# Patient Record
Sex: Male | Born: 1955
Health system: Southern US, Community
[De-identification: ages and names within clinical notes are randomized; demographics above are authoritative.]

## PROBLEM LIST (undated history)

## (undated) ENCOUNTER — Ambulatory Visit: Payer: Self-pay

## (undated) DIAGNOSIS — E119 Type 2 diabetes mellitus without complications: Secondary | ICD-10-CM

---

## 2016-12-27 ENCOUNTER — Encounter (HOSPITAL_COMMUNITY): Payer: Self-pay | Admitting: Emergency Medicine

## 2016-12-27 ENCOUNTER — Inpatient Hospital Stay (HOSPITAL_COMMUNITY)
Admission: EM | Admit: 2016-12-27 | Discharge: 2017-01-15 | DRG: 341 | Disposition: A | Discharge status: Home / Self Care | Payer: Self-pay | Attending: Surgery | Admitting: Surgery

## 2016-12-27 ENCOUNTER — Emergency Department (HOSPITAL_COMMUNITY): Payer: Self-pay

## 2016-12-27 ENCOUNTER — Ambulatory Visit (HOSPITAL_COMMUNITY)
Admission: EM | Admit: 2016-12-27 | Discharge: 2016-12-27 | Disposition: A | Discharge status: Home / Self Care | Payer: Self-pay

## 2016-12-27 ENCOUNTER — Other Ambulatory Visit: Payer: Self-pay

## 2016-12-27 DIAGNOSIS — Z9889 Other specified postprocedural states: Secondary | ICD-10-CM

## 2016-12-27 DIAGNOSIS — R000 Tachycardia, unspecified: Secondary | ICD-10-CM | POA: Diagnosis not present

## 2016-12-27 DIAGNOSIS — K352 Acute appendicitis with generalized peritonitis, without abscess: Principal | ICD-10-CM | POA: Diagnosis present

## 2016-12-27 DIAGNOSIS — I2602 Saddle embolus of pulmonary artery with acute cor pulmonale: Secondary | ICD-10-CM | POA: Diagnosis present

## 2016-12-27 DIAGNOSIS — Z79899 Other long term (current) drug therapy: Secondary | ICD-10-CM

## 2016-12-27 DIAGNOSIS — D72829 Elevated white blood cell count, unspecified: Secondary | ICD-10-CM

## 2016-12-27 DIAGNOSIS — K567 Ileus, unspecified: Secondary | ICD-10-CM | POA: Diagnosis not present

## 2016-12-27 DIAGNOSIS — R109 Unspecified abdominal pain: Secondary | ICD-10-CM

## 2016-12-27 DIAGNOSIS — E119 Type 2 diabetes mellitus without complications: Secondary | ICD-10-CM | POA: Diagnosis present

## 2016-12-27 DIAGNOSIS — K3580 Unspecified acute appendicitis: Secondary | ICD-10-CM | POA: Diagnosis present

## 2016-12-27 DIAGNOSIS — Z23 Encounter for immunization: Secondary | ICD-10-CM

## 2016-12-27 DIAGNOSIS — I82401 Acute embolism and thrombosis of unspecified deep veins of right lower extremity: Secondary | ICD-10-CM | POA: Diagnosis not present

## 2016-12-27 DIAGNOSIS — R1110 Vomiting, unspecified: Secondary | ICD-10-CM

## 2016-12-27 DIAGNOSIS — J189 Pneumonia, unspecified organism: Secondary | ICD-10-CM | POA: Diagnosis not present

## 2016-12-27 DIAGNOSIS — K3589 Other acute appendicitis without perforation or gangrene: Secondary | ICD-10-CM

## 2016-12-27 DIAGNOSIS — R339 Retention of urine, unspecified: Secondary | ICD-10-CM | POA: Diagnosis present

## 2016-12-27 DIAGNOSIS — Z6841 Body Mass Index (BMI) 40.0 and over, adult: Secondary | ICD-10-CM

## 2016-12-27 DIAGNOSIS — I2699 Other pulmonary embolism without acute cor pulmonale: Secondary | ICD-10-CM

## 2016-12-27 DIAGNOSIS — I82411 Acute embolism and thrombosis of right femoral vein: Secondary | ICD-10-CM | POA: Diagnosis not present

## 2016-12-27 DIAGNOSIS — N179 Acute kidney failure, unspecified: Secondary | ICD-10-CM | POA: Diagnosis not present

## 2016-12-27 DIAGNOSIS — R198 Other specified symptoms and signs involving the digestive system and abdomen: Secondary | ICD-10-CM

## 2016-12-27 DIAGNOSIS — J9601 Acute respiratory failure with hypoxia: Secondary | ICD-10-CM | POA: Diagnosis not present

## 2016-12-27 DIAGNOSIS — Z4659 Encounter for fitting and adjustment of other gastrointestinal appliance and device: Secondary | ICD-10-CM

## 2016-12-27 DIAGNOSIS — Y95 Nosocomial condition: Secondary | ICD-10-CM | POA: Diagnosis not present

## 2016-12-27 HISTORY — DX: Type 2 diabetes mellitus without complications: E11.9

## 2016-12-27 LAB — I-STAT CG4 LACTIC ACID, ED: Lactic Acid, Venous: 3.56 mmol/L (ref 0.5–1.9)

## 2016-12-27 LAB — URINALYSIS, ROUTINE W REFLEX MICROSCOPIC
BILIRUBIN URINE: NEGATIVE
Glucose, UA: 150 mg/dL — AB
HGB URINE DIPSTICK: NEGATIVE
Ketones, ur: NEGATIVE mg/dL
Leukocytes, UA: NEGATIVE
Nitrite: NEGATIVE
PH: 5 (ref 5.0–8.0)
Protein, ur: NEGATIVE mg/dL
Specific Gravity, Urine: 1.025 (ref 1.005–1.030)

## 2016-12-27 LAB — COMPREHENSIVE METABOLIC PANEL
ALT: 15 U/L — ABNORMAL LOW (ref 17–63)
ANION GAP: 11 (ref 5–15)
AST: 18 U/L (ref 15–41)
Albumin: 3.4 g/dL — ABNORMAL LOW (ref 3.5–5.0)
Alkaline Phosphatase: 81 U/L (ref 38–126)
BILIRUBIN TOTAL: 1.2 mg/dL (ref 0.3–1.2)
BUN: 18 mg/dL (ref 6–20)
CO2: 20 mmol/L — AB (ref 22–32)
Calcium: 9 mg/dL (ref 8.9–10.3)
Chloride: 101 mmol/L (ref 101–111)
Creatinine, Ser: 1.65 mg/dL — ABNORMAL HIGH (ref 0.61–1.24)
GFR calc Af Amer: 50 mL/min — ABNORMAL LOW (ref >60)
GFR calc non Af Amer: 43 mL/min — ABNORMAL LOW (ref >60)
GLUCOSE: 215 mg/dL — AB (ref 65–99)
POTASSIUM: 4 mmol/L (ref 3.5–5.1)
Sodium: 132 mmol/L — ABNORMAL LOW (ref 135–145)
Total Protein: 7.7 g/dL (ref 6.5–8.1)

## 2016-12-27 LAB — CBC
HEMATOCRIT: 47.5 % (ref 39.0–52.0)
HEMOGLOBIN: 15.9 g/dL (ref 13.0–17.0)
MCH: 31.2 pg (ref 26.0–34.0)
MCHC: 33.5 g/dL (ref 30.0–36.0)
MCV: 93.3 fL (ref 78.0–100.0)
Platelets: 300 10*3/uL (ref 150–400)
RBC: 5.09 MIL/uL (ref 4.22–5.81)
RDW: 15.2 % (ref 11.5–15.5)
WBC: 10.1 10*3/uL (ref 4.0–10.5)

## 2016-12-27 LAB — LIPASE, BLOOD: Lipase: 15 U/L (ref 11–51)

## 2016-12-27 MED ORDER — MORPHINE SULFATE (PF) 4 MG/ML IV SOLN
4.0000 mg | Freq: Once | INTRAVENOUS | Status: AC
  Administered 2016-12-27: 4 mg via INTRAVENOUS
  Filled 2016-12-27: qty 1

## 2016-12-27 MED ORDER — SODIUM CHLORIDE 0.9 % IV BOLUS (SEPSIS)
1000.0000 mL | Freq: Once | INTRAVENOUS | Status: AC
  Administered 2016-12-27: 1000 mL via INTRAVENOUS

## 2016-12-27 MED ORDER — IOPAMIDOL (ISOVUE-300) INJECTION 61%
INTRAVENOUS | Status: AC
  Administered 2016-12-27: 100 mL
  Filled 2016-12-27: qty 100

## 2016-12-27 NOTE — ED Notes (Signed)
Pt reports Urgent care sent him to ED for further evaluation.

## 2016-12-27 NOTE — ED Triage Notes (Signed)
Patient arrives with complaint of lower abdominal pain. Onset 1 week ago. States he ate a large meal, felt fine after. Laid down and then noticed the pain afterward. 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 Denies history of hernias. Patient with temp of 100.2 in triage. Denies NVD and urinary symptoms.

## 2016-12-27 NOTE — ED Triage Notes (Signed)
PT C/O: groin pain  ONSET: 1 week  SX INCLUDE: pain increases w/activity  DENIES: inj/trauma  TAKING MEDS: none  A&O x4... NAD... Ambulatory

## 2016-12-27 NOTE — Discharge Instructions (Signed)
Go directly to the ED

## 2016-12-27 NOTE — ED Provider Notes (Addendum)
12/27/2016 7:20 PM   DOB: 06-Jun-1955 / MRN: [id number]  SUBJECTIVE:  Tyler Franklin is a 61 y.o. male presenting for abdominal pain that started last night.  Feels that he is getting worse quickly.  Denies fever but admits to some chills.  Has a history of diabetes and no information is available for review.  Has not been able to have a bowel movement since yesterday. No history of hernia.  No history of abdominal surgery.   He has No Known Allergies.   He  has a past medical history of Diabetes mellitus without complication (HCC).    He  reports that  has never smoked. he has never used smokeless tobacco. He reports that he does not drink alcohol or use drugs. He  has no sexual activity history on file. The patient  has no past surgical history on file.  His family history is not on file.  Review of Systems  Respiratory: Negative for shortness of breath.   Cardiovascular: Negative for chest pain, orthopnea and leg swelling.  Gastrointestinal: Positive for abdominal pain and constipation. Negative for blood in stool, diarrhea, heartburn, melena, nausea and vomiting.  Neurological: Negative for dizziness.    OBJECTIVE:  BP 116/76 (BP Location: Left Arm)   Pulse (!) 125   Temp 98.2 F (36.8 C) (Oral)   Resp 18   SpO2 95%   BP Readings from Last 3 Encounters:  12/27/16 116/76     Physical Exam  Constitutional: He is oriented to person, place, and time. He appears well-developed. He is active and cooperative.  Non-toxic appearance.  Cardiovascular: Normal rate, regular rhythm, S1 normal, S2 normal, normal heart sounds, intact distal pulses and normal pulses. Exam reveals no gallop and no friction rub.  No murmur heard. Pulmonary/Chest: Effort normal. No stridor. No tachypnea. No respiratory distress. He has no wheezes. He has no rales.  Abdominal: Soft. He exhibits no distension and no mass. There is tenderness. There is guarding. There is no rebound. Hernia confirmed negative in the  right inguinal area and confirmed negative in the left inguinal area.  Genitourinary: Testes normal. Right testis shows no mass, no swelling and no tenderness. Right testis is descended. Left testis shows no mass, no swelling and no tenderness. Left testis is descended. Uncircumcised.  Musculoskeletal: He exhibits no edema, tenderness or deformity.  Lymphadenopathy:       Right: No inguinal adenopathy present.       Left: No inguinal adenopathy present.  Neurological: He is alert and oriented to person, place, and time. He displays normal reflexes. No cranial nerve deficit. He exhibits normal muscle tone. Coordination normal.  Skin: Skin is warm and dry. He is not diaphoretic. No pallor.  Vitals reviewed.   No results found for this or any previous visit (from the past 72 hour(s)).  No results found.  ASSESSMENT AND PLAN:  The primary encounter diagnosis was Abdominal involuntary guarding. A diagnosis of Tachycardia was also pertinent to this visit. History of diabetes with no information in the system.  I feel that he needs a CT scan given his exam. He reluctantly agrees to go to the ED and has a family member with him who will escort him.     The patient is advised to call or return to clinic if he does not see an improvement in symptoms, or to seek the care of the closest emergency department if he worsens with the above plan.   Deliah BostonMichael Franklin, MHS, PA-C 12/27/2016 7:20 PM  Tyler Franklin, Tyler L, PA-C 12/27/16 1921    Tyler Franklin, Tyler L, PA-C 12/27/16 Ernestina [id number]

## 2016-12-27 NOTE — ED Notes (Signed)
Patient transported to CT 

## 2016-12-27 NOTE — ED Provider Notes (Signed)
[facility] Provider Note   CSN: [id number] Arrival date & time: 12/27/16  1927     History   Chief Complaint Chief Complaint  Patient presents with  . Abdominal Pain    HPI Tyler Franklin is a 61 y.o. male.  The history is provided by the patient and a relative.  Abdominal Pain     61 year old male with history of diabetes, presenting to the ED from urgent care for abdominal pain.  Reports this began on Wednesday but worsened yesterday after eating Thanksgiving dinner.  The pain localized to the lower abdomen.  States pain does not seem to radiate anywhere else.  He denies any nausea, vomiting, or diarrhea.  States he has not had any trouble with bowel movements.  He is able to pass flatus when needed.  No prior abdominal surgeries.  At urgent care patient was noted to have a low-grade temperature, patient reports "I was wearing too many closed because it is cold out".  He has not had any medications PTA.  Past Medical History:  Diagnosis Date  . Diabetes mellitus without complication (HCC)     There are no active problems to display for this patient.   History reviewed. No pertinent surgical history.     Home Medications    Prior to Admission medications   Medication Sig Start Date End Date Taking? Authorizing Provider  Multiple Vitamin (ONE-A-DAY MENS PO) Take 1 tablet by mouth daily.   Yes [provider]    Family History History reviewed. No pertinent family history.  Social History Social History   Tobacco Use  . Smoking status: Never Smoker  . Smokeless tobacco: Never Used  Substance Use Topics  . Alcohol use: No    Frequency: Never  . Drug use: No     Allergies   Patient has no known allergies.   Review of Systems Review of Systems  Gastrointestinal: Positive for abdominal pain.  All other systems reviewed and are negative.    Physical Exam Updated Vital Signs BP 103/66   Pulse (!) 108    Temp 97.8 F (36.6 C) (Oral)   Resp 18   Ht 5\' 11"  (1.803 m)   Wt 131.5 kg (290 lb)   SpO2 94%   BMI 40.45 kg/m   Physical Exam  Constitutional: He is oriented to person, place, and time. He appears well-developed and well-nourished.  HENT:  Head: Normocephalic and atraumatic.  Mouth/Throat: Oropharynx is clear and moist.  Eyes: Conjunctivae and EOM are normal. Pupils are equal, round, and reactive to light.  Neck: Normal range of motion.  Cardiovascular: Normal rate, regular rhythm and normal heart sounds.  Pulmonary/Chest: Effort normal and breath sounds normal.  Abdominal: Soft. Bowel sounds are normal. There is tenderness in the right lower quadrant, suprapubic area and left lower quadrant.  Tenderness all across lower abdomen, feels a little firm but not rigid; normal bowel sounds  Musculoskeletal: Normal range of motion.  Neurological: He is alert and oriented to person, place, and time.  Skin: Skin is warm and dry.  Psychiatric: He has a normal mood and affect.  Nursing note and vitals reviewed.    ED Treatments / Results  Labs (all labs ordered are listed, but only abnormal results are displayed) Labs Reviewed  COMPREHENSIVE METABOLIC PANEL - Abnormal; Notable for the following components:      Result Value   Sodium 132 (*)    CO2 20 (*)    Glucose, Bld 215 (*)  Creatinine, Ser 1.65 (*)    Albumin 3.4 (*)    ALT 15 (*)    GFR calc non Af Amer 43 (*)    GFR calc Af Amer 50 (*)    All other components within normal limits  URINALYSIS, ROUTINE W REFLEX MICROSCOPIC - Abnormal; Notable for the following components:   Color, Urine AMBER (*)    APPearance HAZY (*)    Glucose, UA 150 (*)    All other components within normal limits  I-STAT CG4 LACTIC ACID, ED - Abnormal; Notable for the following components:   Lactic Acid, Venous 3.56 (*)    All other components within normal limits  I-STAT CG4 LACTIC ACID, ED - Abnormal; Notable for the following components:    Lactic Acid, Venous 2.74 (*)    All other components within normal limits  LIPASE, BLOOD  CBC  HIV ANTIBODY (ROUTINE TESTING)  BASIC METABOLIC PANEL  CBC  HEMOGLOBIN A1C    EKG  EKG Interpretation None       Radiology Ct Abdomen Pelvis W Contrast  Result Date: 12/27/2016 CLINICAL DATA:  Lower abdominal pain starting 1 week ago. EXAM: CT ABDOMEN AND PELVIS WITH CONTRAST TECHNIQUE: Multidetector CT imaging of the abdomen and pelvis was performed using the standard protocol following bolus administration of intravenous contrast. CONTRAST:  100mL ISOVUE-300 IOPAMIDOL (ISOVUE-300) INJECTION 61% COMPARISON:  None. FINDINGS: Lower chest: Top-normal size heart with streaky atelectasis at each lung base. No effusion or pneumothorax. Hepatobiliary: Mild hepatic steatosis. There is colonic interposition between the liver and ventral abdominal wall. Faint layering densities within the gallbladder may reflect biliary sludge and/or non opaque stones. No biliary dilatation. Pancreas: Normal Spleen: Normal Adrenals/Urinary Tract: Normal bilateral adrenal glands. Water attenuating cysts are seen within both kidneys the largest is in the upper pole of the left kidney measuring up to 11.7 cm. Stomach/Bowel: Appendix: Location: McBurney's point, series 3 image 73 and coronal series 6, images 27 through 33 Diameter: 18 mm in diameter Appendicolith: Fecal residue is seen within distally. Mucosal hyper-enhancement: Present Extraluminal gas: No Periappendiceal collection: No Fluid distended stomach. Normal small bowel rotation. The colon is nonacute. Vascular/Lymphatic: No significant vascular findings are present. No enlarged abdominal or pelvic lymph nodes. Reproductive: Prostate is unremarkable. Other: No abdominal wall hernia or abnormality. No abdominopelvic ascites. Musculoskeletal: No acute osseous abnormality. Mild thoracolumbar spondylosis. IMPRESSION: Distended hyperemic appendix located at McBurney's point  containing soft fecal residue within and measuring up to 18 mm in diameter. No complications of abscess nor perforation. Electronically Signed   By: Tollie Ethavid  Kwon M.D.   On: 12/27/2016 23:38    Procedures Procedures (including critical care time)  Medications Ordered in ED Medications  morphine 4 MG/ML injection 4 mg (4 mg Intravenous Given 12/27/16 2250)  sodium chloride 0.9 % bolus 1,000 mL (1,000 mLs Intravenous New Bag/Given 12/27/16 2250)     Initial Impression / Assessment and Plan / ED Course  I have reviewed the triage vital signs and the nursing notes.  Pertinent labs & imaging results that were available during my care of the patient were reviewed by me and considered in my medical decision making (see chart for details).  61 year old male here with lower abdominal pain.  Began Thursday evening and has been worsening.  Had low-grade fever and tachycardia at urgent care, transferred here for further evaluation.  Lactate elevated at 3.56.  White count is normal.  Bump in creatinine noted, patient does report he feels dehydrated.  IV fluids and pain  medications ordered.  Will obtain CT scan for further evaluation.  CT scan with evidence of acute appendicitis.  Lactic acid seems to be trending down but still mildly elevated at 2.74.  In light of this, we will go ahead and start Zosyn.  Discussed with on-call general surgery, Dr. Doylene Canard, she will admit for ongoing care.  Plan for OR this morning.  Final Clinical Impressions(s) / ED Diagnoses   Final diagnoses:  Other acute appendicitis    ED Discharge Orders    None       Garlon Hatchet, PA-C 12/28/16 1610    Gilda Crease, MD 12/28/16 [phone]

## 2016-12-28 ENCOUNTER — Encounter (HOSPITAL_COMMUNITY): Admission: EM | Disposition: A | Discharge status: Home / Self Care | Payer: Self-pay | Source: Home / Self Care

## 2016-12-28 ENCOUNTER — Observation Stay (HOSPITAL_COMMUNITY): Payer: Self-pay | Admitting: Anesthesiology

## 2016-12-28 ENCOUNTER — Encounter (HOSPITAL_COMMUNITY): Payer: Self-pay | Admitting: Anesthesiology

## 2016-12-28 DIAGNOSIS — K3580 Unspecified acute appendicitis: Secondary | ICD-10-CM | POA: Diagnosis present

## 2016-12-28 HISTORY — PX: LAPAROSCOPIC APPENDECTOMY: SHX408

## 2016-12-28 LAB — CBC
HEMATOCRIT: 44.1 % (ref 39.0–52.0)
HEMOGLOBIN: 14.9 g/dL (ref 13.0–17.0)
MCH: 31.6 pg (ref 26.0–34.0)
MCHC: 33.8 g/dL (ref 30.0–36.0)
MCV: 93.6 fL (ref 78.0–100.0)
PLATELETS: 253 10*3/uL (ref 150–400)
RBC: 4.71 MIL/uL (ref 4.22–5.81)
RDW: 15.1 % (ref 11.5–15.5)
WBC: 17.4 10*3/uL — AB (ref 4.0–10.5)

## 2016-12-28 LAB — BASIC METABOLIC PANEL
ANION GAP: 9 (ref 5–15)
BUN: 22 mg/dL — AB (ref 6–20)
CHLORIDE: 103 mmol/L (ref 101–111)
CO2: 21 mmol/L — ABNORMAL LOW (ref 22–32)
Calcium: 8.1 mg/dL — ABNORMAL LOW (ref 8.9–10.3)
Creatinine, Ser: 2.13 mg/dL — ABNORMAL HIGH (ref 0.61–1.24)
GFR calc Af Amer: 37 mL/min — ABNORMAL LOW (ref >60)
GFR, EST NON AFRICAN AMERICAN: 32 mL/min — AB (ref >60)
Glucose, Bld: 192 mg/dL — ABNORMAL HIGH (ref 65–99)
POTASSIUM: 5.9 mmol/L — AB (ref 3.5–5.1)
SODIUM: 133 mmol/L — AB (ref 135–145)

## 2016-12-28 LAB — GLUCOSE, CAPILLARY
GLUCOSE-CAPILLARY: 192 mg/dL — AB (ref 65–99)
GLUCOSE-CAPILLARY: 169 mg/dL — AB (ref 65–99)
GLUCOSE-CAPILLARY: 193 mg/dL — AB (ref 65–99)
Glucose-Capillary: 125 mg/dL — ABNORMAL HIGH (ref 65–99)
Glucose-Capillary: 122 mg/dL — ABNORMAL HIGH (ref 65–99)

## 2016-12-28 LAB — I-STAT CG4 LACTIC ACID, ED: Lactic Acid, Venous: 2.74 mmol/L (ref 0.5–1.9)

## 2016-12-28 LAB — HIV ANTIBODY (ROUTINE TESTING W REFLEX): HIV SCREEN 4TH GENERATION: NONREACTIVE

## 2016-12-28 SURGERY — APPENDECTOMY, LAPAROSCOPIC
Anesthesia: General | Site: Abdomen

## 2016-12-28 MED ORDER — HEPARIN SODIUM (PORCINE) 5000 UNIT/ML IJ SOLN
5000.0000 [IU] | Freq: Three times a day (TID) | INTRAMUSCULAR | Status: DC
  Administered 2016-12-28 – 2017-01-06 (×28): 5000 [IU] via SUBCUTANEOUS
  Filled 2016-12-28 (×28): qty 1

## 2016-12-28 MED ORDER — PROPOFOL 10 MG/ML IV BOLUS
INTRAVENOUS | Status: AC
  Filled 2016-12-28: qty 40

## 2016-12-28 MED ORDER — ACETAMINOPHEN 650 MG RE SUPP: 650.0000 mg | Freq: Four times a day (QID) | RECTAL | Status: DC | PRN

## 2016-12-28 MED ORDER — FENTANYL CITRATE (PF) 250 MCG/5ML IJ SOLN
INTRAMUSCULAR | Status: AC
  Filled 2016-12-28: qty 5

## 2016-12-28 MED ORDER — DOCUSATE SODIUM 100 MG PO CAPS
100.0000 mg | ORAL_CAPSULE | Freq: Two times a day (BID) | ORAL | Status: DC
  Administered 2016-12-28 – 2017-01-02 (×10): 100 mg via ORAL
  Filled 2016-12-28 (×11): qty 1

## 2016-12-28 MED ORDER — ACETAMINOPHEN 325 MG PO TABS
650.0000 mg | ORAL_TABLET | Freq: Four times a day (QID) | ORAL | Status: DC | PRN
  Administered 2017-01-08: 650 mg via ORAL
  Filled 2016-12-28: qty 2

## 2016-12-28 MED ORDER — HYDRALAZINE HCL 20 MG/ML IJ SOLN: 10.0000 mg | INTRAMUSCULAR | Status: DC | PRN

## 2016-12-28 MED ORDER — SUCCINYLCHOLINE CHLORIDE 20 MG/ML IJ SOLN
INTRAMUSCULAR | Status: DC | PRN
  Administered 2016-12-28: 100 mg via INTRAVENOUS

## 2016-12-28 MED ORDER — PHENYLEPHRINE HCL 10 MG/ML IJ SOLN
INTRAMUSCULAR | Status: DC | PRN
  Administered 2016-12-28 (×4): 40 ug via INTRAVENOUS

## 2016-12-28 MED ORDER — POLYETHYLENE GLYCOL 3350 17 G PO PACK: 17.0000 g | PACK | Freq: Every day | ORAL | Status: DC | PRN

## 2016-12-28 MED ORDER — ROCURONIUM BROMIDE 100 MG/10ML IV SOLN
INTRAVENOUS | Status: DC | PRN
  Administered 2016-12-28: 10 mg via INTRAVENOUS
  Administered 2016-12-28: 40 mg via INTRAVENOUS

## 2016-12-28 MED ORDER — HYDROMORPHONE HCL 1 MG/ML IJ SOLN: 0.2500 mg | INTRAMUSCULAR | Status: DC | PRN

## 2016-12-28 MED ORDER — EPHEDRINE SULFATE 50 MG/ML IJ SOLN
INTRAMUSCULAR | Status: DC | PRN
  Administered 2016-12-28: 5 mg via INTRAVENOUS

## 2016-12-28 MED ORDER — PIPERACILLIN-TAZOBACTAM 3.375 G IVPB 30 MIN
3.3750 g | Freq: Once | INTRAVENOUS | Status: AC
  Administered 2016-12-28: 3.375 g via INTRAVENOUS
  Filled 2016-12-28: qty 50

## 2016-12-28 MED ORDER — OXYCODONE HCL 5 MG PO TABS
5.0000 mg | ORAL_TABLET | ORAL | Status: DC | PRN
Start: 2016-12-28 — End: 2017-01-03
  Administered 2016-12-28: 5 mg via ORAL
  Administered 2016-12-28 – 2017-01-01 (×9): 10 mg via ORAL
  Filled 2016-12-28 (×2): qty 2
  Filled 2016-12-28: qty 1
  Filled 2016-12-28 (×8): qty 2

## 2016-12-28 MED ORDER — BISACODYL 10 MG RE SUPP: 10.0000 mg | Freq: Every day | RECTAL | Status: DC | PRN

## 2016-12-28 MED ORDER — SODIUM CHLORIDE 0.9 % IV SOLN
INTRAVENOUS | Status: DC
  Administered 2016-12-28: 05:00:00 via INTRAVENOUS
  Administered 2016-12-28: 1 mL via INTRAVENOUS
  Administered 2016-12-29 – 2016-12-30 (×2): via INTRAVENOUS

## 2016-12-28 MED ORDER — ONDANSETRON HCL 4 MG/2ML IJ SOLN
INTRAMUSCULAR | Status: DC | PRN
  Administered 2016-12-28: 4 mg via INTRAVENOUS

## 2016-12-28 MED ORDER — METOPROLOL TARTRATE 5 MG/5ML IV SOLN: 5.0000 mg | Freq: Four times a day (QID) | INTRAVENOUS | Status: DC | PRN

## 2016-12-28 MED ORDER — HYDROMORPHONE HCL 1 MG/ML IJ SOLN
0.5000 mg | INTRAMUSCULAR | Status: DC | PRN
  Administered 2016-12-28 – 2017-01-10 (×15): 0.5 mg via INTRAVENOUS
  Filled 2016-12-28 (×11): qty 1
  Filled 2016-12-28: qty 0.5
  Filled 2016-12-28 (×5): qty 1

## 2016-12-28 MED ORDER — ONDANSETRON HCL 4 MG/2ML IJ SOLN
4.0000 mg | Freq: Four times a day (QID) | INTRAMUSCULAR | Status: DC | PRN
Start: 2016-12-28 — End: 2017-01-15
  Administered 2017-01-02 – 2017-01-03 (×4): 4 mg via INTRAVENOUS
  Filled 2016-12-28 (×4): qty 2

## 2016-12-28 MED ORDER — DIPHENHYDRAMINE HCL 12.5 MG/5ML PO ELIX
12.5000 mg | ORAL_SOLUTION | Freq: Four times a day (QID) | ORAL | Status: DC | PRN
  Filled 2016-12-28: qty 10
  Filled 2016-12-28: qty 5

## 2016-12-28 MED ORDER — PROPOFOL 10 MG/ML IV BOLUS
INTRAVENOUS | Status: DC | PRN
  Administered 2016-12-28: 20 mg via INTRAVENOUS
  Administered 2016-12-28: 160 mg via INTRAVENOUS

## 2016-12-28 MED ORDER — MIDAZOLAM HCL 5 MG/5ML IJ SOLN
INTRAMUSCULAR | Status: DC | PRN
Start: 2016-12-28 — End: 2016-12-28
  Administered 2016-12-28 (×2): 1 mg via INTRAVENOUS

## 2016-12-28 MED ORDER — 0.9 % SODIUM CHLORIDE (POUR BTL) OPTIME
TOPICAL | Status: DC | PRN
  Administered 2016-12-28: 1000 mL

## 2016-12-28 MED ORDER — PIPERACILLIN-TAZOBACTAM 3.375 G IVPB
3.3750 g | Freq: Three times a day (TID) | INTRAVENOUS | Status: DC
  Administered 2016-12-28 – 2017-01-10 (×40): 3.375 g via INTRAVENOUS
  Filled 2016-12-28 (×43): qty 50

## 2016-12-28 MED ORDER — FENTANYL CITRATE (PF) 250 MCG/5ML IJ SOLN
INTRAMUSCULAR | Status: DC | PRN
  Administered 2016-12-28 (×2): 50 ug via INTRAVENOUS

## 2016-12-28 MED ORDER — MIDAZOLAM HCL 2 MG/2ML IJ SOLN
INTRAMUSCULAR | Status: AC
  Filled 2016-12-28: qty 2

## 2016-12-28 MED ORDER — ONDANSETRON 4 MG PO TBDP
4.0000 mg | ORAL_TABLET | Freq: Four times a day (QID) | ORAL | Status: DC | PRN
  Filled 2016-12-28: qty 1

## 2016-12-28 MED ORDER — SODIUM CHLORIDE 0.9 % IR SOLN
Status: DC | PRN
  Administered 2016-12-28: 1000 mL

## 2016-12-28 MED ORDER — DIPHENHYDRAMINE HCL 50 MG/ML IJ SOLN
12.5000 mg | Freq: Four times a day (QID) | INTRAMUSCULAR | Status: DC | PRN
Start: 2016-12-28 — End: 2017-01-15

## 2016-12-28 MED ORDER — INSULIN ASPART 100 UNIT/ML ~~LOC~~ SOLN
0.0000 [IU] | SUBCUTANEOUS | Status: DC
  Administered 2016-12-28: 4 [IU] via SUBCUTANEOUS
  Administered 2016-12-28: 3 [IU] via SUBCUTANEOUS
  Administered 2016-12-28: 4 [IU] via SUBCUTANEOUS
  Administered 2016-12-29: 3 [IU] via SUBCUTANEOUS
  Administered 2016-12-29: 4 [IU] via SUBCUTANEOUS
  Administered 2016-12-30 (×3): 3 [IU] via SUBCUTANEOUS
  Administered 2016-12-30: 4 [IU] via SUBCUTANEOUS
  Administered 2016-12-31: 3 [IU] via SUBCUTANEOUS
  Administered 2016-12-31: 4 [IU] via SUBCUTANEOUS
  Administered 2017-01-01 – 2017-01-04 (×9): 3 [IU] via SUBCUTANEOUS
  Administered 2017-01-04: 4 [IU] via SUBCUTANEOUS
  Administered 2017-01-04: 3 [IU] via SUBCUTANEOUS
  Administered 2017-01-04: 4 [IU] via SUBCUTANEOUS
  Administered 2017-01-04: 3 [IU] via SUBCUTANEOUS
  Administered 2017-01-05 (×3): 4 [IU] via SUBCUTANEOUS
  Administered 2017-01-05: 7 [IU] via SUBCUTANEOUS
  Administered 2017-01-05 – 2017-01-06 (×4): 4 [IU] via SUBCUTANEOUS
  Administered 2017-01-06 (×2): 7 [IU] via SUBCUTANEOUS
  Administered 2017-01-06: 4 [IU] via SUBCUTANEOUS
  Administered 2017-01-06: 3 [IU] via SUBCUTANEOUS
  Administered 2017-01-06 – 2017-01-09 (×15): 4 [IU] via SUBCUTANEOUS

## 2016-12-28 MED ORDER — BUPIVACAINE-EPINEPHRINE 0.25% -1:200000 IJ SOLN
INTRAMUSCULAR | Status: DC | PRN
  Administered 2016-12-28: 17 mL

## 2016-12-28 MED ORDER — LIDOCAINE HCL (CARDIAC) 20 MG/ML IV SOLN
INTRAVENOUS | Status: DC | PRN
  Administered 2016-12-28: 60 mg via INTRATRACHEAL

## 2016-12-28 MED ORDER — LACTATED RINGERS IV SOLN
INTRAVENOUS | Status: DC | PRN
  Administered 2016-12-28 (×2): via INTRAVENOUS

## 2016-12-28 MED ORDER — SUGAMMADEX SODIUM 500 MG/5ML IV SOLN
INTRAVENOUS | Status: DC | PRN
  Administered 2016-12-28: 400 mg via INTRAVENOUS

## 2016-12-28 MED ORDER — PHENOL 1.4 % MT LIQD
1.0000 | OROMUCOSAL | Status: DC | PRN
  Filled 2016-12-28: qty 177

## 2016-12-28 MED ORDER — BUPIVACAINE-EPINEPHRINE (PF) 0.25% -1:200000 IJ SOLN
INTRAMUSCULAR | Status: AC
  Filled 2016-12-28: qty 30

## 2016-12-28 SURGICAL SUPPLY — 45 items
APPLIER CLIP 5 13 M/L LIGAMAX5 (MISCELLANEOUS)
BLADE CLIPPER SURG (BLADE) IMPLANT
CANISTER SUCT 3000ML PPV (MISCELLANEOUS) ×3 IMPLANT
CHLORAPREP W/TINT 26ML (MISCELLANEOUS) ×3 IMPLANT
CLIP APPLIE 5 13 M/L LIGAMAX5 (MISCELLANEOUS) IMPLANT
COVER SURGICAL LIGHT HANDLE (MISCELLANEOUS) ×3 IMPLANT
CUTTER ENDO LINEAR 45M (STAPLE) ×3 IMPLANT
CUTTER FLEX LINEAR 45M (STAPLE) ×3 IMPLANT
DERMABOND ADVANCED (GAUZE/BANDAGES/DRESSINGS) ×2
DERMABOND ADVANCED .7 DNX12 (GAUZE/BANDAGES/DRESSINGS) ×1 IMPLANT
DEVICE PMI PUNCTURE CLOSURE (MISCELLANEOUS) ×3 IMPLANT
DRAIN CHANNEL 19F RND (DRAIN) ×3 IMPLANT
ELECT REM PT RETURN 9FT ADLT (ELECTROSURGICAL) ×3
ELECTRODE REM PT RTRN 9FT ADLT (ELECTROSURGICAL) ×1 IMPLANT
EVACUATOR SILICONE 100CC (DRAIN) ×3 IMPLANT
GAUZE SPONGE 4X4 12PLY STRL (GAUZE/BANDAGES/DRESSINGS) ×3 IMPLANT
GLOVE BIO SURGEON STRL SZ 6 (GLOVE) ×6 IMPLANT
GLOVE BIO SURGEON STRL SZ7 (GLOVE) ×3 IMPLANT
GLOVE BIOGEL PI IND STRL 6.5 (GLOVE) ×2 IMPLANT
GLOVE BIOGEL PI IND STRL 7.5 (GLOVE) ×1 IMPLANT
GLOVE BIOGEL PI INDICATOR 6.5 (GLOVE) ×4
GLOVE BIOGEL PI INDICATOR 7.5 (GLOVE) ×2
GOWN STRL REUS W/ TWL LRG LVL3 (GOWN DISPOSABLE) ×3 IMPLANT
GOWN STRL REUS W/TWL LRG LVL3 (GOWN DISPOSABLE) ×6
KIT BASIN OR (CUSTOM PROCEDURE TRAY) ×3 IMPLANT
KIT ROOM TURNOVER OR (KITS) ×3 IMPLANT
NEEDLE INSUFFLATION 14GA 120MM (NEEDLE) ×3 IMPLANT
NS IRRIG 1000ML POUR BTL (IV SOLUTION) ×3 IMPLANT
PAD ARMBOARD 7.5X6 YLW CONV (MISCELLANEOUS) ×6 IMPLANT
POUCH SPECIMEN RETRIEVAL 10MM (ENDOMECHANICALS) ×3 IMPLANT
RELOAD 45 VASCULAR/THIN (ENDOMECHANICALS) IMPLANT
RELOAD STAPLE TA45 3.5 REG BLU (ENDOMECHANICALS) ×3 IMPLANT
SCISSORS ENDO CVD 5DCS (MISCELLANEOUS) IMPLANT
SET IRRIG TUBING LAPAROSCOPIC (IRRIGATION / IRRIGATOR) ×3 IMPLANT
SHEARS HARMONIC ACE PLUS 36CM (ENDOMECHANICALS) IMPLANT
SLEEVE ENDOPATH XCEL 5M (ENDOMECHANICALS) ×3 IMPLANT
SPECIMEN JAR SMALL (MISCELLANEOUS) ×3 IMPLANT
SUT ETHILON 2 0 FS 18 (SUTURE) ×3 IMPLANT
SUT MNCRL AB 4-0 PS2 18 (SUTURE) ×3 IMPLANT
TOWEL OR 17X24 6PK STRL BLUE (TOWEL DISPOSABLE) ×3 IMPLANT
TRAY FOLEY CATH SILVER 16FR (SET/KITS/TRAYS/PACK) ×3 IMPLANT
TRAY LAPAROSCOPIC MC (CUSTOM PROCEDURE TRAY) ×3 IMPLANT
TROCAR XCEL 12X100 BLDLESS (ENDOMECHANICALS) ×3 IMPLANT
TROCAR XCEL NON-BLD 5MMX100MML (ENDOMECHANICALS) ×3 IMPLANT
TUBING INSUFFLATION (TUBING) ×3 IMPLANT

## 2016-12-28 NOTE — Progress Notes (Addendum)
Subjective: Abdominal pain improved compared to pre-op - now just sore. Sitting up drinking liquid breakfast. Endorses urinary retention this AM. Denies fever, chills, nausea, vomiting.   I discussed the surgical findings with the patient and the role for continued IV abx and drain and answered his questions.    Creat bettrer  Objective: Vitals:   12/28/16 0530 12/28/16 0604  BP: 96/62 98/64  Pulse: (!) 109 (!) 108  Resp: (!) 35 (!) 32  Temp: 98.5 F (36.9 C) 98.3 F (36.8 C)  SpO2: 99% 98%   Exam:  Gen: alert, cooperative, NAD Pulm: normal effort Abd: soft, appropriately tender, JP drain in place with mod amt serous/slightly cloudly drainage, hypoactive BS, trochar sites c/d/i    A&P: Acute perforated appendicitis POD#0 S/P laparoscopic appendectomy, placement 19Flake drain 12/28/16 Dr. Fredricka Bonineonnor - afebrile, VSS, mild sinus tachyardia (108 bpm)  - clear liquids as tolerated - monitor UOP, may need I&O if unable to void in next 3-4 hours - continue IV abx  - mobilize, IS   Acute Kidney Injury-improved  FEN: clears ID: Zosyn 11/24 >> VTE: SCD's, SQ heparin Foley: removed   Hosie SpangleElizabeth Simaan, [facility] Pager: [phone] Consults: [phone] Mon-Fri 7:00 am-4:30 pm Sat-Sun 7:00 am-11:30 am

## 2016-12-28 NOTE — Anesthesia Postprocedure Evaluation (Signed)
Anesthesia Post Note  Patient: Tyler OsierMichael Franklin  Procedure(s) Performed: APPENDECTOMY LAPAROSCOPIC (N/A Abdomen)     Patient location during evaluation: PACU Anesthesia Type: General Level of consciousness: awake and alert Pain management: pain level controlled Vital Signs Assessment: post-procedure vital signs reviewed and stable Respiratory status: spontaneous breathing, nonlabored ventilation and respiratory function stable Cardiovascular status: blood pressure returned to baseline and stable Postop Assessment: no apparent nausea or vomiting Anesthetic complications: no    Last Vitals:  Vitals:   12/28/16 0530 12/28/16 0604  BP: 96/62 98/64  Pulse: (!) 109 (!) 108  Resp: (!) 35 (!) 32  Temp: 36.9 C 36.8 C  SpO2: 99% 98%    Last Pain:  Vitals:   12/28/16 0604  TempSrc: Oral  PainSc:                  Jourdan Durbin,W. EDMOND

## 2016-12-28 NOTE — Anesthesia Preprocedure Evaluation (Addendum)
Anesthesia Evaluation  Patient identified by MRN, date of birth, ID band Patient awake    Reviewed: Allergy & Precautions, H&P , NPO status , Patient's Chart, lab work & pertinent test results  Airway Mallampati: III  TM Distance: >3 FB Neck ROM: Full    Dental no notable dental hx. (+) Teeth Intact, Poor Dentition, Dental Advisory Given   Pulmonary neg pulmonary ROS,    Pulmonary exam normal breath sounds clear to auscultation       Cardiovascular negative cardio ROS   Rhythm:Regular Rate:Normal     Neuro/Psych negative neurological ROS  negative psych ROS   GI/Hepatic negative GI ROS, Neg liver ROS,   Endo/Other  diabetesMorbid obesity  Renal/GU negative Renal ROS  negative genitourinary   Musculoskeletal   Abdominal   Peds  Hematology negative hematology ROS (+)   Anesthesia Other Findings   Reproductive/Obstetrics negative OB ROS                            Anesthesia Physical Anesthesia Plan  ASA: II and emergent  Anesthesia Plan: General   Post-op Pain Management:    Induction: Intravenous, Rapid sequence and Cricoid pressure planned  PONV Risk Score and Plan: 3 and Ondansetron and Midazolam  Airway Management Planned: Oral ETT  Additional Equipment:   Intra-op Plan:   Post-operative Plan: Extubation in OR  Informed Consent: I have reviewed the patients History and Physical, chart, labs and discussed the procedure including the risks, benefits and alternatives for the proposed anesthesia with the patient or authorized representative who has indicated his/her understanding and acceptance.   Dental advisory given  Plan Discussed with: CRNA, Anesthesiologist and Surgeon  Anesthesia Plan Comments:        Anesthesia Quick Evaluation

## 2016-12-28 NOTE — H&P (Signed)
Surgical H&P  CC: abdominal pain  HPI: very pleasant 61 year old man with a history of diabetes who presents with a 1-2 day history of lower abdominal pain. The pain began vaguely on Wednesday but worsened yesterday after Thanksgiving dinner and has localized to the right lower quadrant. No associated nausea or vomiting. No diarrhea. Pain does not radiate. He initially presented to urgent care where he was found to have a low-grade temperature and given concerns for appendicitis he is referred to the emergency department. Here he has undergone labs revealing a leukocytosis and CT scan consistent with acute appendicitis as listed below. He has never had any abdominal surgery.   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 He is here today with his brother and sister.   No Known Allergies  Past Medical History:  Diagnosis Date  . Diabetes mellitus without complication (HCC)     History reviewed. No pertinent surgical history.  History reviewed. No pertinent family history.  Social History   Socioeconomic History  . Marital status: Single    Spouse name: None  . Number of children: None  . Years of education: None  . Highest education level: None  Social Needs  . Financial resource strain: None  . Food insecurity - worry: None  . Food insecurity - inability: None  . Transportation needs - medical: None  . Transportation needs - non-medical: None  Occupational History  . None  Tobacco Use  . Smoking status: Never Smoker  . Smokeless tobacco: Never Used  Substance and Sexual Activity  . Alcohol use: No    Frequency: Never  . Drug use: No  . Sexual activity: None  Other Topics Concern  . None  Social History Narrative  . None    No current facility-administered medications on file prior to encounter.    Current Outpatient Medications on File Prior to Encounter  Medication Sig Dispense Refill  . Multiple Vitamin (ONE-A-DAY MENS PO) Take 1 tablet by  mouth daily.      Review of Systems: a complete, 10pt review of systems was completed with pertinent positives and negatives as documented in the HPI  Physical Exam: Vitals:   12/27/16 2335 12/27/16 2345  BP: 116/66 111/68  Pulse: (!) 114 (!) 110  Resp:    Temp:    SpO2: 91% 93%   Gen: A&Ox3, no distress  Head: normocephalic, atraumatic, EOMI, anicteric.  Neck: supple without mass or thyromegaly Chest: unlabored respirations, symmetrical air entry   Cardiovascular: RRR with palpable distal pulses, no pedal edema Abdomen: soft, obese, nondistended, tender in the lower midline in the right lower quadrant with voluntary guarding. Chronically incarcerated umbilical hernia present.No mass or organomegaly.  Extremities: warm, without edema, no deformities  Neuro: grossly intact Psych: appropriate mood and affect  Skin: warm and dry   CBC Latest Ref Rng & Units 12/27/2016  WBC 4.0 - 10.5 K/uL 10.1  Hemoglobin 13.0 - 17.0 g/dL 19.115.9  Hematocrit 47.839.0 - 52.0 % 47.5  Platelets 150 - 400 K/uL 300    CMP Latest Ref Rng & Units 12/27/2016  Glucose 65 - 99 mg/dL 295(A215(H)  BUN 6 - 20 mg/dL 18  Creatinine 2.130.61 - 0.861.24 mg/dL 5.78(I1.65(H)  Sodium 696135 - 295145 mmol/L 132(L)  Potassium 3.5 - 5.1 mmol/L 4.0  Chloride 101 - 111 mmol/L 101  CO2 22 - 32 mmol/L 20(L)  Calcium 8.9 - 10.3 mg/dL 9.0  Total Protein 6.5 - 8.1 g/dL 7.7  Total Bilirubin  0.3 - 1.2 mg/dL 1.2  Alkaline Phos 38 - 126 U/L 81  AST 15 - 41 U/L 18  ALT 17 - 63 U/L 15(L)    No results found for: INR, PROTIME  Imaging: Ct Abdomen Pelvis W Contrast  Result Date: 12/27/2016 CLINICAL DATA:  Lower abdominal pain starting 1 week ago. EXAM: CT ABDOMEN AND PELVIS WITH CONTRAST TECHNIQUE: Multidetector CT imaging of the abdomen and pelvis was performed using the standard protocol following bolus administration of intravenous contrast. CONTRAST:  100mL ISOVUE-300 IOPAMIDOL (ISOVUE-300) INJECTION 61% COMPARISON:  None. FINDINGS: Lower chest:  Top-normal size heart with streaky atelectasis at each lung base. No effusion or pneumothorax. Hepatobiliary: Mild hepatic steatosis. There is colonic interposition between the liver and ventral abdominal wall. Faint layering densities within the gallbladder may reflect biliary sludge and/or non opaque stones. No biliary dilatation. Pancreas: Normal Spleen: Normal Adrenals/Urinary Tract: Normal bilateral adrenal glands. Water attenuating cysts are seen within both kidneys the largest is in the upper pole of the left kidney measuring up to 11.7 cm. Stomach/Bowel: Appendix: Location: McBurney's point, series 3 image 73 and coronal series 6, images 27 through 33 Diameter: 18 mm in diameter Appendicolith: Fecal residue is seen within distally. Mucosal hyper-enhancement: Present Extraluminal gas: No Periappendiceal collection: No Fluid distended stomach. Normal small bowel rotation. The colon is nonacute. Vascular/Lymphatic: No significant vascular findings are present. No enlarged abdominal or pelvic lymph nodes. Reproductive: Prostate is unremarkable. Other: No abdominal wall hernia or abnormality. No abdominopelvic ascites. Musculoskeletal: No acute osseous abnormality. Mild thoracolumbar spondylosis. IMPRESSION: Distended hyperemic appendix located at McBurney's point containing soft fecal residue within and measuring up to 18 mm in diameter. No complications of abscess nor perforation. Electronically Signed   By: Tollie Ethavid  Kwon M.D.   On: 12/27/2016 23:38     A/P: 61 year old man with acute appendicitis. I discussed with him laparoscopic appendectomy including the technique of the surgery, risks of bleeding, infection, pain, scarring, intra-abdominal injury, staple line leak, abscess, ileus, as well as general risks including blood clots, pneumonia, heart attack, stroke and death. He and his family had several and Cipro questions all of which were answered to their satisfaction. We will plan for laparoscopic  appendectomy as soon as the OR is available.    Phylliss Blakeshelsea Sahalie Beth, MD [facility] [place] Pager [phone]

## 2016-12-28 NOTE — Transfer of Care (Signed)
Immediate Anesthesia Transfer of Care Note  Patient: Tyler Franklin  Procedure(s) Performed: APPENDECTOMY LAPAROSCOPIC (N/A Abdomen)  Patient Location: PACU  Anesthesia Type:General  Level of Consciousness: drowsy  Airway & Oxygen Therapy: Patient Spontanous Breathing and Patient connected to face mask oxygen  Post-op Assessment: Report given to RN and Post -op Vital signs reviewed and stable  Post vital signs: Reviewed and stable  Last Vitals:  Vitals:   12/28/16 0200 12/28/16 0217  BP: 104/66   Pulse: (!) 106   Resp:    Temp:  37.1 C  SpO2: 92%     Last Pain:  Vitals:   12/28/16 0217  TempSrc: Oral  PainSc:          Complications: No apparent anesthesia complications

## 2016-12-28 NOTE — Anesthesia Procedure Notes (Signed)
Procedure Name: Intubation Date/Time: 12/28/2016 3:09 AM Performed by: Claudina LickMahony, Rai Sinagra D, CRNA Pre-anesthesia Checklist: Patient identified, Emergency Drugs available, Suction available, Patient being monitored and Timeout performed Patient Re-evaluated:Patient Re-evaluated prior to induction Oxygen Delivery Method: Circle system utilized Preoxygenation: Pre-oxygenation with 100% oxygen Induction Type: IV induction, Rapid sequence and Cricoid Pressure applied Laryngoscope Size: Miller and 2 Grade View: Grade I Tube type: Oral Tube size: 7.5 mm Number of attempts: 1 Airway Equipment and Method: Stylet Placement Confirmation: ETT inserted through vocal cords under direct vision,  positive ETCO2 and breath sounds checked- equal and bilateral Secured at: 22 cm Tube secured with: Tape Dental Injury: Teeth and Oropharynx as per pre-operative assessment

## 2016-12-28 NOTE — Op Note (Signed)
Operative Report  Tyler Franklin 61 y.o. male  [id number]  [id number]  12/28/2016  Surgeon: Christepher Melchior A Dwon Sky   Assistant: OR staff  Procedure performed: Laparoscopic Appendectomy  Preop diagnosis: Acute appendicitis  Post-op diagnosis/intraop findings: Acute appendicitis - with ruptured appendix - with generalized peritonitis, purulent  Specimens: appendix  EBL: minimal Retained items: 19fr round blake drain Complications: none  Description of procedure: After obtaining informed consent the patient was brought to the operating room. Antibiotics and subcutaneous heparin were administered. SCD's were applied. General endotracheal anesthesia was initiated and a formal time-out was performed. The abdomen was prepped and draped in the usual sterile fashion and the abdomen was entered using an infraumbilical Veress needle and insufflated to 15 mmHg. A 5 mm trocar and camera were then introduced, the abdomen was inspected and there is no evidence of injury from our entry. There was diffuse purulent peritonitis and moderate volume frank pus in the pelvis. A suprapubic 5 mm trocar and a left lower quadrant 12 mm trocar were introduced under direct visualization following infiltration with local. The patient was then placed in Trendelenburg and rotated to the left and the small bowel was reflected cephalad. The pus was aspirated. Purulent interloop adhesions were gently bluntly lysed until normal anatomy was identified. The terminal ileum, cecum, and appendix were visualized. A small, hyperemic fatty appendage was noted on the distal terminal ileum, it did not seem to communicate with the lumen. This was clearly a separate entity from the acute process and was left alone. The cecum appeared to be secondarily inflamed. The sigmoid colon had adhered via a fatty appendage to a perforation in the mid body of the appendix and this was gently swept away. Spillage of fecal material from the appendix was  noted.  The appendix was grasped and the harmonic scalpel was used to skeletonize the appendix dividing it from its mesentery all the way down to the base of the appendix. Great care was taken to ensure no injury to surrounding retroperitoneal structures, cecum or terminal ileum. A blue load linear cutting stapler was used to transect the appendix from the cecum. The tissue was dense and woody but did appear viable and the staple line intact on completion.  Hemostasis was ensured. The appendix was placed in an Endo Catch bag and removed through our 12 mm trocar site. The abdomen was then again inspected and all visible pus aspirated. The abdomen was irrigated copiously with warm sterile saline and the effluent was clear. A 19fr round blake drain was introduced through the 2GraMs BanKentuckyd ODErvin 62LincolnCapKentuckye CDErvin 3Memorial HospitKentuckyal DErvin 52Excelsior Mesa VKentuckyiewDErvin 6PiccaKentuckyrd DErvin 58SouPremierKentucky EnDErvin 63Dos MilleniKentuckyum DErvin KentuckyBooDErvin 4CentuKentuckyry DErvin 68Robert J. DKentuckyoleDErvin 72CouncMercy MediKentuckycalDErvin 59SpeaLincoln DigestKentuckyiveDErvin 68Mil1800 Mcdonough RoKentuckyad DErvin 26[place] [facility] KentuckyHosDErvin 58CliftondaPeKentuckyterDErvin 44NManalapKentuckyan DErvin 53KKentuckyeamDErvin Knackphus JeRich Braved to sit in the pelvis and right lower quadrant. This was secured at the skin with a 2-0 nylon. The abdomen was desufflated and all trocars removed. The skin incisions were closed with running subcuticular monocryl and Dermabond. The patient was awakened, extubated and transported to the recovery room in stable condition.   All counts were correct at the completion of the case.

## 2016-12-29 ENCOUNTER — Encounter (HOSPITAL_COMMUNITY): Payer: Self-pay | Admitting: Surgery

## 2016-12-29 LAB — BASIC METABOLIC PANEL
ANION GAP: 10 (ref 5–15)
BUN: 20 mg/dL (ref 6–20)
CO2: 21 mmol/L — AB (ref 22–32)
Calcium: 8.3 mg/dL — ABNORMAL LOW (ref 8.9–10.3)
Chloride: 103 mmol/L (ref 101–111)
Creatinine, Ser: 1.57 mg/dL — ABNORMAL HIGH (ref 0.61–1.24)
GFR calc Af Amer: 53 mL/min — ABNORMAL LOW (ref >60)
GFR, EST NON AFRICAN AMERICAN: 46 mL/min — AB (ref >60)
GLUCOSE: 136 mg/dL — AB (ref 65–99)
Potassium: 4.4 mmol/L (ref 3.5–5.1)
Sodium: 134 mmol/L — ABNORMAL LOW (ref 135–145)

## 2016-12-29 LAB — CBC
HEMATOCRIT: 43 % (ref 39.0–52.0)
Hemoglobin: 14 g/dL (ref 13.0–17.0)
MCH: 30.4 pg (ref 26.0–34.0)
MCHC: 32.6 g/dL (ref 30.0–36.0)
MCV: 93.5 fL (ref 78.0–100.0)
PLATELETS: 236 10*3/uL (ref 150–400)
RBC: 4.6 MIL/uL (ref 4.22–5.81)
RDW: 15.5 % (ref 11.5–15.5)
WBC: 19 10*3/uL — AB (ref 4.0–10.5)

## 2016-12-29 LAB — HEMOGLOBIN A1C
Hgb A1c MFr Bld: 7.2 % — ABNORMAL HIGH (ref 4.8–5.6)
Mean Plasma Glucose: 160 mg/dL

## 2016-12-29 LAB — GLUCOSE, CAPILLARY
GLUCOSE-CAPILLARY: 163 mg/dL — AB (ref 65–99)
GLUCOSE-CAPILLARY: 147 mg/dL — AB (ref 65–99)
GLUCOSE-CAPILLARY: 115 mg/dL — AB (ref 65–99)
GLUCOSE-CAPILLARY: 134 mg/dL — AB (ref 65–99)
Glucose-Capillary: 116 mg/dL — ABNORMAL HIGH (ref 65–99)
Glucose-Capillary: 149 mg/dL — ABNORMAL HIGH (ref 65–99)

## 2016-12-29 NOTE — Progress Notes (Signed)
1 Day Post-Op  Subjective: Stable and alert.  Says he vomited once. Pain control reasonably good. Heart rate 107.  Afebrile.  Good urine output.  280 mL out drain.  Drainage mostly serous.    Objective: Vital signs in last 24 hours: Temp:  [98.3 F (36.8 C)-100.3 F (37.9 C)] 98.3 F (36.8 C) (11/25 0452) Pulse Rate:  [107-116] 107 (11/25 0452) Resp:  [21-32] 21 (11/25 0452) BP: (100-131)/(60-78) 118/72 (11/25 0452) SpO2:  [90 %-100 %] 92 % (11/25 0452) Last BM Date: 12/26/16  Intake/Output from previous day: 11/24 0701 - 11/25 0700 In: 2320.8 [P.O.:300; I.V.:1920.8; IV Piggyback:100] Out: 1780 [Urine:1500; Drains:280] Intake/Output this shift: No intake/output data recorded.  General appearance: Alert.  Mental status normal.  Minimal distress Resp: clear to auscultation bilaterally GI: Soft.  Wounds okay.  Silent.  Doesn't seem distended.  Lab Results:  Results for orders placed or performed during the hospital encounter of 12/27/16 (from the past 24 hour(s))  Glucose, capillary     Status: Abnormal   Collection Time: 12/28/16 11:46 AM  Result Value Ref Range   Glucose-Capillary 193 (H) 65 - 99 mg/dL  Glucose, capillary     Status: Abnormal   Collection Time: 12/28/16  4:04 PM  Result Value Ref Range   Glucose-Capillary 125 (H) 65 - 99 mg/dL  Glucose, capillary     Status: Abnormal   Collection Time: 12/28/16  7:59 PM  Result Value Ref Range   Glucose-Capillary 122 (H) 65 - 99 mg/dL  Glucose, capillary     Status: Abnormal   Collection Time: 12/29/16 12:13 AM  Result Value Ref Range   Glucose-Capillary 116 (H) 65 - 99 mg/dL  Glucose, capillary     Status: Abnormal   Collection Time: 12/29/16  4:51 AM  Result Value Ref Range   Glucose-Capillary 149 (H) 65 - 99 mg/dL  CBC     Status: Abnormal   Collection Time: 12/29/16  5:51 AM  Result Value Ref Range   WBC 19.0 (H) 4.0 - 10.5 K/uL   RBC 4.60 4.22 - 5.81 MIL/uL   Hemoglobin 14.0 13.0 - 17.0 g/dL   HCT 08.643.0  57.839.0 - 46.952.0 %   MCV 93.5 78.0 - 100.0 fL   MCH 30.4 26.0 - 34.0 pg   MCHC 32.6 30.0 - 36.0 g/dL   RDW 62.915.5 52.811.5 - 41.315.5 %   Platelets 236 150 - 400 K/uL  Basic metabolic panel     Status: Abnormal   Collection Time: 12/29/16  5:51 AM  Result Value Ref Range   Sodium 134 (L) 135 - 145 mmol/L   Potassium 4.4 3.5 - 5.1 mmol/L   Chloride 103 101 - 111 mmol/L   CO2 21 (L) 22 - 32 mmol/L   Glucose, Bld 136 (H) 65 - 99 mg/dL   BUN 20 6 - 20 mg/dL   Creatinine, Ser 2.441.57 (H) 0.61 - 1.24 mg/dL   Calcium 8.3 (L) 8.9 - 10.3 mg/dL   GFR calc non Af Amer 46 (L) >60 mL/min   GFR calc Af Amer 53 (L) >60 mL/min   Anion gap 10 5 - 15  Glucose, capillary     Status: Abnormal   Collection Time: 12/29/16  8:12 AM  Result Value Ref Range   Glucose-Capillary 163 (H) 65 - 99 mg/dL     Studies/Results: No results found.  . docusate sodium  100 mg Oral BID  . heparin  5,000 Units Subcutaneous Q8H  . insulin aspart  0-20 Units  Subcutaneous Q4H     Assessment/Plan: s/p Procedure(s): APPENDECTOMY LAPAROSCOPIC  Acute perforated appendicitis with peritonitis POD #1.  Laparoscopic appendectomy and placement of drain, Dr. Fredricka Bonineonnor Has expected ileus and I explained that to him  Mobilize, ambulate, incentive spirometry, Lovenox, Zosyn  Diabetes mellitus.  Glucose ranged from 116-163.  Adequate control for postop patient.  SSI.  @PROBHOSP @  LOS: 1 day    Ernestene MentionHaywood M Dorena Dorfman 12/29/2016  . .prob

## 2016-12-30 ENCOUNTER — Other Ambulatory Visit: Payer: Self-pay

## 2016-12-30 LAB — GLUCOSE, CAPILLARY
GLUCOSE-CAPILLARY: 121 mg/dL — AB (ref 65–99)
GLUCOSE-CAPILLARY: 134 mg/dL — AB (ref 65–99)
GLUCOSE-CAPILLARY: 115 mg/dL — AB (ref 65–99)
Glucose-Capillary: 124 mg/dL — ABNORMAL HIGH (ref 65–99)
Glucose-Capillary: 133 mg/dL — ABNORMAL HIGH (ref 65–99)
Glucose-Capillary: 161 mg/dL — ABNORMAL HIGH (ref 65–99)

## 2016-12-30 MED ORDER — INFLUENZA VAC SPLIT QUAD 0.5 ML IM SUSY
0.5000 mL | PREFILLED_SYRINGE | INTRAMUSCULAR | Status: AC
  Administered 2016-12-31: 0.5 mL via INTRAMUSCULAR
  Filled 2016-12-30: qty 0.5

## 2016-12-30 MED ORDER — PNEUMOCOCCAL VAC POLYVALENT 25 MCG/0.5ML IJ INJ
0.5000 mL | INJECTION | INTRAMUSCULAR | Status: AC
  Administered 2016-12-31: 0.5 mL via INTRAMUSCULAR
  Filled 2016-12-30: qty 0.5

## 2016-12-30 NOTE — Progress Notes (Signed)
[facility] Progress Note  2 Days Post-Op  Subjective: CC: Abdominal pain Mr. Katrinka BlazingSmith states his abdominal pain is improving but he is still sore. He is sitting up comfortably at time of exam and states he has been able to ambulate. He denies any N/V/D currently but had 1 episode of vomiting yesterday. He is tolerating a clear liquid diet. He denies flatus or BM but states he has been belching. He has been using his IS. Denies fever, chills, or urinary retention.   Objective: Vital signs in last 24 hours: Temp:  [97.7 F (36.5 C)-98 F (36.7 C)] 97.7 F (36.5 C) (11/26 0428) Pulse Rate:  [106-121] 106 (11/26 0428) Resp:  [16-21] 16 (11/26 0428) BP: (120-131)/(62-74) 123/71 (11/26 0428) SpO2:  [9 %-94 %] 94 % (11/26 0428) Last BM Date: 12/26/16  Intake/Output from previous day: 11/25 0701 - 11/26 0700 In: 2945 [P.O.:720; I.V.:2025; IV Piggyback:200] Out: 1288 [Urine:1150; Drains:138] Intake/Output this shift: No intake/output data recorded.  PE: Gen:  Alert, NAD, pleasant Pulm:  Normal effort Abd: Soft, tender to palpation RLQ, non-distended, bowel sounds present in all 4 quadrants  Wound: dressing from 12/29/16 at 2200 with blood tinged serous drainage through dressing. No erythema or warmth around drain site.  Drain: serous fluid in drain Skin: warm and dry, no rashes  Psych: A&Ox3   Lab Results:  Recent Labs    12/28/16 0515 12/29/16 0551  WBC 17.4* 19.0*  HGB 14.9 14.0  HCT 44.1 43.0  PLT 253 236   BMET Recent Labs    12/28/16 0515 12/29/16 0551  NA 133* 134*  K 5.9* 4.4  CL 103 103  CO2 21* 21*  GLUCOSE 192* 136*  BUN 22* 20  CREATININE 2.13* 1.57*  CALCIUM 8.1* 8.3*   PT/INR No results for input(s): LABPROT, INR in the last 72 hours. CMP     Component Value Date/Time   NA 134 (L) 12/29/2016 0551   K 4.4 12/29/2016 0551   CL 103 12/29/2016 0551   CO2 21 (L) 12/29/2016 0551   GLUCOSE 136 (H) 12/29/2016 0551   BUN 20 12/29/2016 0551    CREATININE 1.57 (H) 12/29/2016 0551   CALCIUM 8.3 (L) 12/29/2016 0551   PROT 7.7 12/27/2016 2015   ALBUMIN 3.4 (L) 12/27/2016 2015   AST 18 12/27/2016 2015   ALT 15 (L) 12/27/2016 2015   ALKPHOS 81 12/27/2016 2015   BILITOT 1.2 12/27/2016 2015   GFRNONAA 46 (L) 12/29/2016 0551   GFRAA 53 (L) 12/29/2016 0551   Lipase     Component Value Date/Time   LIPASE 15 12/27/2016 2015       Studies/Results: No results found.  Anti-infectives: Anti-infectives (From admission, onward)   Start     Dose/Rate Route Frequency Ordered Stop   12/28/16 0600  piperacillin-tazobactam (ZOSYN) IVPB 3.375 g     3.375 g 12.5 mL/hr over 240 Minutes Intravenous Every 8 hours 12/28/16 0048     12/28/16 0015  piperacillin-tazobactam (ZOSYN) IVPB 3.375 g     3.375 g 100 mL/hr over 30 Minutes Intravenous  Once 12/28/16 0002 12/28/16 0053       Assessment/Plan Acute perforated appendicitis with peritonitis POD #2 - laparoscopic appendectomy and placement of drain by Dr. Fredricka Bonineonnor - afebrile, VSS, mild sinus tachycardia (106 bpm today)  - WBC increased from 17.4 >19.0 - Continue IV abx - Continue with ambulation - Continue incentive spirometry - ileus - given colace this am with second dose available this pm, can use dulcolax  suppository if constipation continues - Pain adequately managed on current regimen and patient has been using oxycodone more infrequently than q4H - Wound: dressing change needed.   FEN: clear liquid ID: Zosyn 11/24 >> DVT: SCDs, SQ heparin    LOS: 2 days    Susy FrizzleMartha Aubrea Meixner , PA-S2 12/30/2016, 11:10 AM

## 2016-12-31 LAB — BASIC METABOLIC PANEL
ANION GAP: 10 (ref 5–15)
BUN: 18 mg/dL (ref 6–20)
CALCIUM: 8.5 mg/dL — AB (ref 8.9–10.3)
CO2: 24 mmol/L (ref 22–32)
Chloride: 101 mmol/L (ref 101–111)
Creatinine, Ser: 1.5 mg/dL — ABNORMAL HIGH (ref 0.61–1.24)
GFR calc Af Amer: 56 mL/min — ABNORMAL LOW (ref >60)
GFR, EST NON AFRICAN AMERICAN: 49 mL/min — AB (ref >60)
GLUCOSE: 105 mg/dL — AB (ref 65–99)
Potassium: 3.9 mmol/L (ref 3.5–5.1)
Sodium: 135 mmol/L (ref 135–145)

## 2016-12-31 LAB — GLUCOSE, CAPILLARY
GLUCOSE-CAPILLARY: 101 mg/dL — AB (ref 65–99)
GLUCOSE-CAPILLARY: 131 mg/dL — AB (ref 65–99)
GLUCOSE-CAPILLARY: 98 mg/dL (ref 65–99)
GLUCOSE-CAPILLARY: 98 mg/dL (ref 65–99)
Glucose-Capillary: 98 mg/dL (ref 65–99)
Glucose-Capillary: 160 mg/dL — ABNORMAL HIGH (ref 65–99)

## 2016-12-31 LAB — CBC
HEMATOCRIT: 42.2 % (ref 39.0–52.0)
Hemoglobin: 14.2 g/dL (ref 13.0–17.0)
MCH: 31.3 pg (ref 26.0–34.0)
MCHC: 33.6 g/dL (ref 30.0–36.0)
MCV: 93 fL (ref 78.0–100.0)
PLATELETS: 310 10*3/uL (ref 150–400)
RBC: 4.54 MIL/uL (ref 4.22–5.81)
RDW: 15.1 % (ref 11.5–15.5)
WBC: 16.5 10*3/uL — ABNORMAL HIGH (ref 4.0–10.5)

## 2016-12-31 MED ORDER — POLYETHYLENE GLYCOL 3350 17 G PO PACK
17.0000 g | PACK | Freq: Every day | ORAL | Status: DC
  Administered 2016-12-31 – 2017-01-02 (×3): 17 g via ORAL
  Filled 2016-12-31 (×3): qty 1

## 2016-12-31 NOTE — Progress Notes (Signed)
Patient's drain dressing has been changed several times today. It puts out large amounts of straw colored fluid whenever he stands, strains or walks to the bathroom.

## 2016-12-31 NOTE — Progress Notes (Signed)
[facility] Progress Note  3 Days Post-Op  Subjective: CC: Abdominal pain  [name] still has some abdominal pain in RLQ but it is improving. He states no BM yet but some flatus this am. He is tolerating clear liquid diet and has been increasingly active and ambulating. He admits to feeling bloated and straining to have BM without being able to. No N/V/D, fever, chills, or urinary symptoms.  Objective: Vital signs in last 24 hours: Temp:  [97.7 F (36.5 C)-98.9 F (37.2 C)] 97.7 F (36.5 C) (11/27 0421) Pulse Rate:  [106-110] 110 (11/27 0421) Resp:  [17] 17 (11/27 0421) BP: (128-131)/(80-85) 131/85 (11/27 0421) SpO2:  [93 %-94 %] 93 % (11/27 0421) Last BM Date: 12/26/16  Intake/Output from previous day: 11/26 0701 - 11/27 0700 In: 2655 [P.O.:780; I.V.:1725; IV Piggyback:150] Out: 40 [Drains:40] Intake/Output this shift: No intake/output data recorded.  PE: Gen:  Alert, NAD, pleasant Pulm:  Normal effort  Abd: tender in RLQ and around drain site, mild distension, bowel sounds present in all 4 quadrants  Wound: Dressing completely saturated with blood tinged serous fluid. Erythema surrounding drain site. Actively draining with removal of dressing.  Drain: Blood tinged serous fluid in drain.  Skin: warm and dry, no rashes  Psych: A&Ox3   Lab Results:  Recent Labs    12/29/16 0551  WBC 19.0*  HGB 14.0  HCT 43.0  PLT 236   BMET Recent Labs    12/29/16 0551  NA 134*  K 4.4  CL 103  CO2 21*  GLUCOSE 136*  BUN 20  CREATININE 1.57*  CALCIUM 8.3*   PT/INR No results for input(s): LABPROT, INR in the last 72 hours. CMP     Component Value Date/Time   NA 134 (L) 12/29/2016 0551   K 4.4 12/29/2016 0551   CL 103 12/29/2016 0551   CO2 21 (L) 12/29/2016 0551   GLUCOSE 136 (H) 12/29/2016 0551   BUN 20 12/29/2016 0551   CREATININE 1.57 (H) 12/29/2016 0551   CALCIUM 8.3 (L) 12/29/2016 0551   PROT 7.7 12/27/2016 2015   ALBUMIN 3.4 (L) 12/27/2016 2015    AST 18 12/27/2016 2015   ALT 15 (L) 12/27/2016 2015   ALKPHOS 81 12/27/2016 2015   BILITOT 1.2 12/27/2016 2015   GFRNONAA 46 (L) 12/29/2016 0551   GFRAA 53 (L) 12/29/2016 0551   Lipase     Component Value Date/Time   LIPASE 15 12/27/2016 2015       Studies/Results: No results found.  Anti-infectives: Anti-infectives (From admission, onward)   Start     Dose/Rate Route Frequency Ordered Stop   12/28/16 0600  piperacillin-tazobactam (ZOSYN) IVPB 3.375 g     3.375 g 12.5 mL/hr over 240 Minutes Intravenous Every 8 hours 12/28/16 0048     12/28/16 0015  piperacillin-tazobactam (ZOSYN) IVPB 3.375 g     3.375 g 100 mL/hr over 30 Minutes Intravenous  Once 12/28/16 0002 12/28/16 0053       Assessment/Plan Acute perforated appendicitis with peritonitis POD #3 - laparoscopic appendectomy and placement of drain by Dr. Fredricka Bonineonnor - afebrile, VSS, mild sinus tachycardia (110 bpm today)  - CBC - ordered and will recheck today - Continue IV abx  - Continue with ambulation - Continue incentive spirometry  - ileus - given colace twice yesterday and continue today, use miralax today. dulcolax suppository available if constipation persists  - Pain: adequately managed on current regimen and patient has been using oxycodone more infrequently than q4H  -  Wound: dressing changed this am  - Diet: continue clear liquids for now, consider advancing to soft diet this pm or tomorrow if able to have BM  FEN: clear liquid ID: Zosyn 11/24 >> DVT: SCDs, SQ heparin    LOS: 3 days    Susy FrizzleMartha Alain Deschene , PA-S2  12/31/2016, 7:40 AM

## 2017-01-01 LAB — GLUCOSE, CAPILLARY
Glucose-Capillary: 103 mg/dL — ABNORMAL HIGH (ref 65–99)
Glucose-Capillary: 104 mg/dL — ABNORMAL HIGH (ref 65–99)
Glucose-Capillary: 110 mg/dL — ABNORMAL HIGH (ref 65–99)
Glucose-Capillary: 117 mg/dL — ABNORMAL HIGH (ref 65–99)
Glucose-Capillary: 119 mg/dL — ABNORMAL HIGH (ref 65–99)
Glucose-Capillary: 120 mg/dL — ABNORMAL HIGH (ref 65–99)
Glucose-Capillary: 122 mg/dL — ABNORMAL HIGH (ref 65–99)
Glucose-Capillary: 141 mg/dL — ABNORMAL HIGH (ref 65–99)

## 2017-01-01 LAB — BASIC METABOLIC PANEL
Anion gap: 8 (ref 5–15)
BUN: 19 mg/dL (ref 6–20)
CALCIUM: 8.3 mg/dL — AB (ref 8.9–10.3)
CHLORIDE: 105 mmol/L (ref 101–111)
CO2: 21 mmol/L — AB (ref 22–32)
CREATININE: 1.29 mg/dL — AB (ref 0.61–1.24)
GFR, EST NON AFRICAN AMERICAN: 58 mL/min — AB (ref >60)
Glucose, Bld: 106 mg/dL — ABNORMAL HIGH (ref 65–99)
Potassium: 3.5 mmol/L (ref 3.5–5.1)
SODIUM: 134 mmol/L — AB (ref 135–145)

## 2017-01-01 LAB — CBC
HCT: 41 % (ref 39.0–52.0)
Hemoglobin: 13.8 g/dL (ref 13.0–17.0)
MCH: 31.2 pg (ref 26.0–34.0)
MCHC: 33.7 g/dL (ref 30.0–36.0)
MCV: 92.6 fL (ref 78.0–100.0)
Platelets: 270 10*3/uL (ref 150–400)
RBC: 4.43 MIL/uL (ref 4.22–5.81)
RDW: 15.3 % (ref 11.5–15.5)
WBC: 12.5 10*3/uL — ABNORMAL HIGH (ref 4.0–10.5)

## 2017-01-01 MED ORDER — ALUM & MAG HYDROXIDE-SIMETH 200-200-20 MG/5ML PO SUSP
30.0000 mL | Freq: Four times a day (QID) | ORAL | Status: DC | PRN
  Administered 2017-01-01: 30 mL via ORAL
  Filled 2017-01-01: qty 30

## 2017-01-01 NOTE — Progress Notes (Signed)
[facility] Progress Note  4 Days Post-Op  Subjective: CC: abdominal pain  [name] states he feels his RLQ abdominal pain is improving. No significant pain with movement or ambulation with primary discomfort related to hospital bed which his says is uncomfortable.  Admits to feeling bloated but tolerating full liquid diet. Passing flatus and one very small BM this am. No N/V/D, fever, chills.  Objective: Vital signs in last 24 hours: Temp:  [98.6 F (37 C)-99.2 F (37.3 C)] 98.6 F (37 C) (11/28 0403) Pulse Rate:  [87-104] 87 (11/28 0403) Resp:  [17-19] 19 (11/28 0403) BP: (133-152)/(73-79) 133/73 (11/28 0403) SpO2:  [90 %-96 %] 96 % (11/28 0403) Last BM Date: 12/26/16  Intake/Output from previous day: 11/27 0701 - 11/28 0700 In: 1350 [P.O.:1300; IV Piggyback:50] Out: 595 [Urine:550; Drains:45] Intake/Output this shift: No intake/output data recorded.  PE: Gen:  Alert, NAD, pleasant Pulm:  Normal effort Abd: Soft, appropriate tenderness in RLQ and around drain site, non-distended, bowel sounds present in all 4 quadrants  Drain: Dressing saturated with serosanguinous fluid. Serous fluid in drain. Skin: warm and dry, no rashes  Psych: A&Ox3   Lab Results:  Recent Labs    12/31/16 0907 12/31/16 2354  WBC 16.5* 12.5*  HGB 14.2 13.8  HCT 42.2 41.0  PLT 310 270   BMET Recent Labs    12/31/16 0955 12/31/16 2354  NA 135 134*  K 3.9 3.5  CL 101 105  CO2 24 21*  GLUCOSE 105* 106*  BUN 18 19  CREATININE 1.50* 1.29*  CALCIUM 8.5* 8.3*   PT/INR No results for input(s): LABPROT, INR in the last 72 hours. CMP     Component Value Date/Time   NA 134 (L) 12/31/2016 2354   K 3.5 12/31/2016 2354   CL 105 12/31/2016 2354   CO2 21 (L) 12/31/2016 2354   GLUCOSE 106 (H) 12/31/2016 2354   BUN 19 12/31/2016 2354   CREATININE 1.29 (H) 12/31/2016 2354   CALCIUM 8.3 (L) 12/31/2016 2354   PROT 7.7 12/27/2016 2015   ALBUMIN 3.4 (L) 12/27/2016 2015   AST 18  12/27/2016 2015   ALT 15 (L) 12/27/2016 2015   ALKPHOS 81 12/27/2016 2015   BILITOT 1.2 12/27/2016 2015   GFRNONAA 58 (L) 12/31/2016 2354   GFRAA >60 12/31/2016 2354   Lipase     Component Value Date/Time   LIPASE 15 12/27/2016 2015       Studies/Results: No results found.  Anti-infectives: Anti-infectives (From admission, onward)   Start     Dose/Rate Route Frequency Ordered Stop   12/28/16 0600  piperacillin-tazobactam (ZOSYN) IVPB 3.375 g     3.375 g 12.5 mL/hr over 240 Minutes Intravenous Every 8 hours 12/28/16 0048     12/28/16 0015  piperacillin-tazobactam (ZOSYN) IVPB 3.375 g     3.375 g 100 mL/hr over 30 Minutes Intravenous  Once 12/28/16 0002 12/28/16 0053       Assessment/Plan Acute perforated appendicitis with peritonitis POD #4- laparoscopic appendectomy and placement of drain by Dr. Fredricka Bonineonnor - afebrile, VSS - CBC - WBC decreased yesterday, 16.5 >>12.5  -Continue IV abx - switch to PO abx when diet advanced  - Continue with ambulation - Continue incentive spirometry  - ileus: resolving. Colace x2 and miralax x1 yesterday, continue today. dulcolax suppository available if constipation persists  - Pain: much improved, adequately managed on current regimen and patient has been using oxycodone more infrequently than q4H  - Wound/Drain: continue dressing changes as needed  -  Diet: continue full liquids for now, consider advancing to soft diet today  FEN: full liquid ID: Zosyn 11/24 >> DVT: SCDs, SQ heparin    LOS: 4 days    Tyler Franklin , PA-S2  01/01/2017, 7:38 AM

## 2017-01-02 ENCOUNTER — Inpatient Hospital Stay (HOSPITAL_COMMUNITY): Payer: Self-pay

## 2017-01-02 LAB — CBC
HCT: 40.9 % (ref 39.0–52.0)
Hemoglobin: 13.5 g/dL (ref 13.0–17.0)
MCH: 30.5 pg (ref 26.0–34.0)
MCHC: 33 g/dL (ref 30.0–36.0)
MCV: 92.3 fL (ref 78.0–100.0)
Platelets: 324 K/uL (ref 150–400)
RBC: 4.43 MIL/uL (ref 4.22–5.81)
RDW: 15 % (ref 11.5–15.5)
WBC: 11.6 K/uL — ABNORMAL HIGH (ref 4.0–10.5)

## 2017-01-02 LAB — GLUCOSE, CAPILLARY
GLUCOSE-CAPILLARY: 128 mg/dL — AB (ref 65–99)
GLUCOSE-CAPILLARY: 102 mg/dL — AB (ref 65–99)
GLUCOSE-CAPILLARY: 99 mg/dL (ref 65–99)
Glucose-Capillary: 109 mg/dL — ABNORMAL HIGH (ref 65–99)
Glucose-Capillary: 115 mg/dL — ABNORMAL HIGH (ref 65–99)

## 2017-01-02 MED ORDER — IOPAMIDOL (ISOVUE-300) INJECTION 61%
INTRAVENOUS | Status: AC
  Administered 2017-01-02: 100 mL
  Filled 2017-01-02: qty 100

## 2017-01-02 MED ORDER — KCL IN DEXTROSE-NACL 20-5-0.45 MEQ/L-%-% IV SOLN
INTRAVENOUS | Status: DC
  Administered 2017-01-02 – 2017-01-03 (×2): via INTRAVENOUS
  Filled 2017-01-02 (×2): qty 1000

## 2017-01-02 NOTE — Progress Notes (Signed)
[facility] Progress Note  5 Days Post-Op  Subjective: CC:  Two episodes of bilious emesis (0130 and 0800). Pt endorses abdominal bloating. Reports that he will get acutely nauseated, vomit, and then feel much better. Reports that he is having flatus but also endorses hiccups and belching. Small BM yesterday but none since. Ambulating.   Objective: Vital signs in last 24 hours: Temp:  [98.3 F (36.8 C)-98.9 F (37.2 C)] 98.3 F (36.8 C) (11/29 0428) Pulse Rate:  [92-95] 92 (11/29 0428) Resp:  [18-19] 18 (11/29 0428) BP: (141-151)/(78-83) 141/78 (11/29 0428) SpO2:  [97 %-98 %] 97 % (11/29 0428) Last BM Date: 01/01/17(very small per pt)  Intake/Output from previous day: 11/28 0701 - 11/29 0700 In: 400 [P.O.:300; IV Piggyback:100] Out: 60 [Drains:60] Intake/Output this shift: Total I/O In: 120 [P.O.:120] Out: -   PE: Gen:  Alert, NAD, pleasant  Pulm:  Normal effort Abd: Soft, minimally tender around drain site, moderate abdominal distention present, hypoactive BS             Drain: 60 cc/24h serous  Skin: warm and dry, no rashes  Psych: A&Ox3     Lab Results:  Recent Labs    12/31/16 2354 01/02/17 0531  WBC 12.5* 11.6*  HGB 13.8 13.5  HCT 41.0 40.9  PLT 270 324   BMET Recent Labs    12/31/16 0955 12/31/16 2354  NA 135 134*  K 3.9 3.5  CL 101 105  CO2 24 21*  GLUCOSE 105* 106*  BUN 18 19  CREATININE 1.50* 1.29*  CALCIUM 8.5* 8.3*   PT/INR No results for input(s): LABPROT, INR in the last 72 hours. CMP     Component Value Date/Time   NA 134 (L) 12/31/2016 2354   K 3.5 12/31/2016 2354   CL 105 12/31/2016 2354   CO2 21 (L) 12/31/2016 2354   GLUCOSE 106 (H) 12/31/2016 2354   BUN 19 12/31/2016 2354   CREATININE 1.29 (H) 12/31/2016 2354   CALCIUM 8.3 (L) 12/31/2016 2354   PROT 7.7 12/27/2016 2015   ALBUMIN 3.4 (L) 12/27/2016 2015   AST 18 12/27/2016 2015   ALT 15 (L) 12/27/2016 2015   ALKPHOS 81 12/27/2016 2015   BILITOT 1.2  12/27/2016 2015   GFRNONAA 58 (L) 12/31/2016 2354   GFRAA >60 12/31/2016 2354   Lipase     Component Value Date/Time   LIPASE 15 12/27/2016 2015       Studies/Results: No results found.  Anti-infectives: Anti-infectives (From admission, onward)   Start     Dose/Rate Route Frequency Ordered Stop   12/28/16 0600  piperacillin-tazobactam (ZOSYN) IVPB 3.375 g     3.375 g 12.5 mL/hr over 240 Minutes Intravenous Every 8 hours 12/28/16 0048     12/28/16 0015  piperacillin-tazobactam (ZOSYN) IVPB 3.375 g     3.375 g 100 mL/hr over 30 Minutes Intravenous  Once 12/28/16 0002 12/28/16 0053       Assessment/Plan Acute perforated appendicitis with peritonitis POD #5- S/P laparoscopic appendectomy and placement of drain 12/28/16 by Dr. Fredricka Bonineonnor - afebrile, VSS, WBC 11.6 from 12.5 - previously tolerating diet but developed bilious emesis 0100 11/29 and D/G Abd shows SB dilation in left abdomen up to 6.0 cm. Place NG tube. CT abdomen to better characterize bowel dilatation and r/o development of post-op intra-abdominal abscess. -Continue IV abx  - Continue with ambulation - Continue incentive spirometry  FEN: NPO, IVF, NG tube to LIWS  ID: Zosyn 11/24 >> DVT: SCDs, SQ heparin  LOS: 5 days    Adam PhenixElizabeth S Simaan , [facility] 01/02/2017, 9:57 AM Pager: [phone] Consults: [phone] Mon-Fri 7:00 am-4:30 pm Sat-Sun 7:00 am-11:30 am

## 2017-01-02 NOTE — Progress Notes (Signed)
Patient called out for RN to come to room. RN found patient projectile vomiting green bile. Patient vomited a large amount. Patient stated he felt better after vomiting and does not need any nausea medicine. RN notified on call general surgery, Tsuei.   RN will continue to monitor patient.  Veatrice KellsMahmoud,Levent Kornegay I, RN

## 2017-01-02 NOTE — Progress Notes (Signed)
Patient's xray results came back, notified MD Dwain SarnaWakefield, he said advance tube 6cm further in and that we could begin to use. Advanced NG tube per orders, placed to LIS, will continue to monitor.

## 2017-01-02 NOTE — Progress Notes (Signed)
Patient had another episode of green bile emesis, he said feels better after, and at the moment did not want any nausea medicine. Notified PA Simaan, said will get an ab x-ray this morning and see what to do after that.

## 2017-01-03 LAB — GLUCOSE, CAPILLARY
GLUCOSE-CAPILLARY: 140 mg/dL — AB (ref 65–99)
GLUCOSE-CAPILLARY: 140 mg/dL — AB (ref 65–99)
Glucose-Capillary: 128 mg/dL — ABNORMAL HIGH (ref 65–99)
Glucose-Capillary: 117 mg/dL — ABNORMAL HIGH (ref 65–99)
Glucose-Capillary: 135 mg/dL — ABNORMAL HIGH (ref 65–99)
Glucose-Capillary: 101 mg/dL — ABNORMAL HIGH (ref 65–99)

## 2017-01-03 LAB — BASIC METABOLIC PANEL
ANION GAP: 7 (ref 5–15)
BUN: 14 mg/dL (ref 6–20)
CO2: 28 mmol/L (ref 22–32)
Calcium: 8.1 mg/dL — ABNORMAL LOW (ref 8.9–10.3)
Chloride: 100 mmol/L — ABNORMAL LOW (ref 101–111)
Creatinine, Ser: 1.35 mg/dL — ABNORMAL HIGH (ref 0.61–1.24)
GFR, EST NON AFRICAN AMERICAN: 55 mL/min — AB (ref >60)
Glucose, Bld: 131 mg/dL — ABNORMAL HIGH (ref 65–99)
POTASSIUM: 3.5 mmol/L (ref 3.5–5.1)
SODIUM: 135 mmol/L (ref 135–145)

## 2017-01-03 LAB — MAGNESIUM: MAGNESIUM: 2.2 mg/dL (ref 1.7–2.4)

## 2017-01-03 LAB — PHOSPHORUS: PHOSPHORUS: 2.6 mg/dL (ref 2.5–4.6)

## 2017-01-03 MED ORDER — MENTHOL 3 MG MT LOZG
1.0000 | LOZENGE | OROMUCOSAL | Status: DC | PRN
  Filled 2017-01-03: qty 9

## 2017-01-03 MED ORDER — TRAVASOL 10 % IV SOLN
INTRAVENOUS | Status: AC
  Administered 2017-01-03: 18:00:00 via INTRAVENOUS
  Filled 2017-01-03: qty 604.8

## 2017-01-03 MED ORDER — SODIUM CHLORIDE 0.9% FLUSH
10.0000 mL | INTRAVENOUS | Status: DC | PRN
  Administered 2017-01-03 – 2017-01-10 (×3): 10 mL
  Filled 2017-01-03 (×3): qty 40

## 2017-01-03 MED ORDER — POTASSIUM CHLORIDE IN NACL 20-0.9 MEQ/L-% IV SOLN
INTRAVENOUS | Status: AC
  Administered 2017-01-03 – 2017-01-04 (×2): via INTRAVENOUS
  Filled 2017-01-03 (×3): qty 1000

## 2017-01-03 NOTE — Progress Notes (Signed)
Peripherally Inserted Central Catheter/Midline Placement  The IV Nurse has discussed with the patient and/or persons authorized to consent for the patient, the purpose of this procedure and the potential benefits and risks involved with this procedure.  The benefits include less needle sticks, lab draws from the catheter, and the patient may be discharged home with the catheter. Risks include, but not limited to, infection, bleeding, blood clot (thrombus formation), and puncture of an artery; nerve damage and irregular heartbeat and possibility to perform a PICC exchange if needed/ordered by physician.  Alternatives to this procedure were also discussed.  Bard Power PICC patient education guide, fact sheet on infection prevention and patient information card has been provided to patient /or left at bedside.    PICC/Midline Placement Documentation  PICC Double Lumen 01/03/17 PICC Right Brachial 2 cm 49 cm (Active)  Indication for Insertion or Continuance of Line Administration of hyperosmolar/irritating solutions (i.e. TPN, Vancomycin, etc.) 01/03/2017  2:55 PM  Exposed Catheter (cm) 2 cm 01/03/2017  2:55 PM  Site Assessment Clean;Dry;Intact 01/03/2017  2:55 PM  Lumen #1 Status Flushed;Saline locked;Blood return noted 01/03/2017  2:55 PM  Lumen #2 Status Flushed;Saline locked;Blood return noted 01/03/2017  2:55 PM  Dressing Type Transparent 01/03/2017  2:55 PM  Dressing Status Clean;Dry;Intact;Antimicrobial disc in place 01/03/2017  2:55 PM  Dressing Change Due 01/10/17 01/03/2017  2:55 PM       Aneshia Jacquet, Lajean ManesKerry Loraine 01/03/2017, 2:55 PM

## 2017-01-03 NOTE — Progress Notes (Signed)
PHARMACY - ADULT TOTAL PARENTERAL NUTRITION CONSULT NOTE   Pharmacy Consult:  TPN Indication:  Prolonged ileus  Patient Measurements: Height: 5\' 11"  (180.3 cm) Weight: 290 lb (131.5 kg) IBW/kg (Calculated) : 75.3 TPN AdjBW (KG): 89.4 Body mass index is 40.45 kg/m.  Assessment:  89 YOM presented on 12/28/16 with abdominal pain that worsened after Thanksgiving.  Found to have acute appendicitis and was taken to the OR for appendectomy.  Patient tolerated diet advancement and then had projectile vomiting with green bile x2 on 01/02/17.  Pharmacy consulted to initiate TPN for prolonged ileus.  GI: NG O/P 2300mL Endo: DM with A1c 7.2% - CBGs 99-140. Insulin requirements in the past 24 hours: 3 units Lytes: K+ 3.5 (goal >/= 4 for ileus), CL slightly low at 100, Phos/Na low normal, Mag WNL Renal: SCr up 1.35, BUN WNL - UOP 0.2 ml/kg/hr, D51/2NS 20K at 75 ml/hr, net +5.7L since admit Pulm: stable on RA Cards: VSS Hepatobil: LFTs WNL 11/23 Neuro: PRN Dilaudid ID: Zosyn D#[id number] (11/24 >> ) for peritonitis, purulent - afebrile, WBC 11.6, no cultures TPN Access: PICC to be placed 01/03/17 TPN start date: 01/03/17   Nutritional Goals (RD rec pending): 2150-2250 kCal and 125-135 gm protein per day  Current Nutrition:  NPO   Plan:  Initiate TPN at 40 ml/hr (goal rate 80 ml/hr).  TPN will provide 985 kCal and 60gm of protein per day. Electrolytes in TPN:  Increase Na / K / Phos slightly, CL:AC 2:1 Daily multivitamin and trace elements in TPN Continue sensitive SSI Q4H.  Watch CBGs (TPN will provide 153gm CHO per day and may need insulin in TPN) Change IVF to NS 20K, will not reduce when TPN starts given SCr trend Standard TPN labs and nursing care orders   Kanav Kazmierczak D. Laney Potashang, PharmD, BCPS Pager:  [phone] 01/03/2017, 10:50 AM

## 2017-01-03 NOTE — Progress Notes (Signed)
Initial Nutrition Assessment  DOCUMENTATION CODES:   Morbid obesity  INTERVENTION:   -No weight since admission; order new weight  -TPN per Pharmacy   NUTRITION DIAGNOSIS:   Inadequate oral intake related to acute illness as evidenced by NPO status.  GOAL:   Patient will meet greater than or equal to 90% of their needs  MONITOR:   Diet advancement, Labs, Weight trends, I & O's(TPN)  REASON FOR ASSESSMENT:   Consult New TPN/TNA  ASSESSMENT:   61 yo male admitted with 1-2 days of abdominal pain with acute perforated appendicitis with peritonitis, taken to OR for appendectomy 11/24. Pt with hx of DM  Pt eating well prior to admission. Diet advanced to CL on 11/24, FL on 11/27 and soft on 11/28, then downgraded to CL on 11/29 and now NPO. Recorded po intake 100% at dinner on 11/26 but no other recorded po intake. Previously tolerating diet but developed post-op ileus with projectile vomiting on 11/29  NG tube with 2300 mL in previous 24 hours, 500 mL out thus far this shift; +flatus PICC line placed today, starting TPN per MD, TPN orders have been placed by RPh  Labs: reviewed Meds: NS with KCl at 75 ml/hr  NUTRITION - FOCUSED PHYSICAL EXAM:    Most Recent Value  Orbital Region  No depletion  Upper Arm Region  No depletion  Thoracic and Lumbar Region  No depletion  Buccal Region  No depletion  Temple Region  No depletion  Clavicle Bone Region  No depletion  Clavicle and Acromion Bone Region  No depletion  Scapular Bone Region  No depletion  Dorsal Hand  No depletion  Patellar Region  No depletion  Anterior Thigh Region  No depletion  Posterior Calf Region  No depletion  Edema (RD Assessment)  Mild  Hair  Reviewed  Eyes  Reviewed  Mouth  Reviewed  Skin  Reviewed  Nails  Reviewed       Diet Order:  Diet NPO time specified TPN ADULT (ION)  EDUCATION NEEDS:   No education needs have been identified at this time  Skin:  Skin Assessment: Reviewed RN  Assessment  Last BM:  11/28  Height:   Ht Readings from Last 1 Encounters:  12/27/16 5\' 11"  (1.803 m)    Weight:   Wt Readings from Last 1 Encounters:  12/27/16 290 lb (131.5 kg)    Ideal Body Weight:  78 kg  BMI:  Body mass index is 40.45 kg/m.  Estimated Nutritional Needs:   Kcal:  2200-2500 kcals  Protein:  125-140 g  Fluid:  >/= 2 L  Romelle Starcherate Akiem Urieta MS, RD, LDN, CNSC [phone] Pager  [phone] Weekend/On-Call Pager

## 2017-01-03 NOTE — Progress Notes (Signed)
[facility] Progress Note  6 Days Post-Op  Subjective: CC:  Mr. Katrinka BlazingSmith has not had recurrent nausea or emesis since NG tube placement but he complains of pain and discomfort related to it. No BM but passing flatus, belching, and hiccups. He states he has not been using his IS much recently.  [facility] but states he has intermittent aching pain in right lower chest. He does not describe timing or alleviating/aggravating factors of this pain.  Objective: Vital signs in last 24 hours: Temp:  [98.2 F (36.8 C)-99.5 F (37.5 C)] 98.2 F (36.8 C) (11/30 0545) Pulse Rate:  [82-97] 82 (11/30 0545) Resp:  [18-19] 18 (11/30 0545) BP: (117-143)/(60-70) 132/60 (11/30 0545) SpO2:  [91 %-95 %] 91 % (11/30 0545) Last BM Date: 01/01/17  Intake/Output from previous day: 11/29 0701 - 11/30 0700 In: 1170 [P.O.:120; I.V.:1050] Out: 2920 [Urine:550; Emesis/NG output:2300; Drains:70] Intake/Output this shift: No intake/output data recorded.  PE: Gen:  Alert, NAD, pleasant Pulm:  Normal effort, clear to auscultation bilaterally Abd: Soft, tender around drain, non-distended, bowel sounds hypoactive in all 4 quadrants  Drain: 70 cc/24h serous  NG tube: 2300 cc/24h bilous Skin: warm and dry, no rashes  Psych: A&Ox3   Lab Results:  Recent Labs    12/31/16 2354 01/02/17 0531  WBC 12.5* 11.6*  HGB 13.8 13.5  HCT 41.0 40.9  PLT 270 324   BMET Recent Labs    12/31/16 0955 12/31/16 2354  NA 135 134*  K 3.9 3.5  CL 101 105  CO2 24 21*  GLUCOSE 105* 106*  BUN 18 19  CREATININE 1.50* 1.29*  CALCIUM 8.5* 8.3*   PT/INR No results for input(s): LABPROT, INR in the last 72 hours. CMP     Component Value Date/Time   NA 134 (L) 12/31/2016 2354   K 3.5 12/31/2016 2354   CL 105 12/31/2016 2354   CO2 21 (L) 12/31/2016 2354   GLUCOSE 106 (H) 12/31/2016 2354   BUN 19 12/31/2016 2354   CREATININE 1.29 (H) 12/31/2016 2354   CALCIUM 8.3 (L) 12/31/2016 2354   PROT 7.7  12/27/2016 2015   ALBUMIN 3.4 (L) 12/27/2016 2015   AST 18 12/27/2016 2015   ALT 15 (L) 12/27/2016 2015   ALKPHOS 81 12/27/2016 2015   BILITOT 1.2 12/27/2016 2015   GFRNONAA 58 (L) 12/31/2016 2354   GFRAA >60 12/31/2016 2354   Lipase     Component Value Date/Time   LIPASE 15 12/27/2016 2015    Studies/Results: Ct Abdomen Pelvis W Contrast  Result Date: 01/02/2017 CLINICAL DATA:  Bilious emesis EXAM: CT ABDOMEN AND PELVIS WITH CONTRAST TECHNIQUE: Multidetector CT imaging of the abdomen and pelvis was performed using the standard protocol following bolus administration of intravenous contrast. CONTRAST:  100mL ISOVUE-300 IOPAMIDOL (ISOVUE-300) INJECTION 61% COMPARISON:  12/27/2016 FINDINGS: Lower chest: Lung bases demonstrate small pleural effusions. Patchy consolidation in the right middle lobe and bilateral lower lobes. Normal heart size. Hepatobiliary: Subcentimeter nonspecific hypodensity in the posterior right hepatic lobe. No calcified gallstones or biliary dilatation Pancreas: Unremarkable. No pancreatic ductal dilatation or surrounding inflammatory changes. Spleen: Normal in size without focal abnormality. Adrenals/Urinary Tract: Adrenal glands are within normal limits. Multiple bilateral cysts in the kidneys, upper pole lesion on the left measures up to 12 cm. Bladder within normal limits Stomach/Bowel: Esophageal tube tip terminates in the proximal stomach. Dilated loops of small bowel measuring up to 4 cm. Transition to nondilated mid to distal small bowel, left lower quadrant, series  3, image number 68. Interval appendectomy Vascular/Lymphatic: No significant vascular findings are present. No enlarged abdominal or pelvic lymph nodes. Reproductive: Prostate is unremarkable. Other: Negative for free air or significant free fluid. Interim placement of a left lower quadrant abdominal drain with the tip terminating just deep to the umbilicus. Small fat in the umbilicus. Mild residual  inflammatory changes in the right lower quadrant but significant improvement compared to prior. Musculoskeletal: Degenerative changes. No acute or suspicious bone lesion. IMPRESSION: 1. Multiple loops of fluid-filled dilated proximal to mid small bowel with transition to nondilated mid to distal small bowel in the left lower quadrant, suggesting an obstruction. 2. Interval appendectomy with decreased right lower quadrant inflammatory changes 3. Small pleural effusions and patchy consolidations in the right middle lobe and lower lobes which may reflect atelectasis or pneumonia. Electronically Signed   By: Jasmine PangKim  Fujinaga M.D.   On: 01/02/2017 20:32   Dg Abd Portable 1v  Result Date: 01/02/2017 CLINICAL DATA:  Feeding tube placement. EXAM: PORTABLE ABDOMEN - 1 VIEW COMPARISON:  01/02/2017. FINDINGS: Feeding tube tip lies at the gastroesophageal junction. Gaseous distention appears unchanged. IMPRESSION: Feeding tube tip lies at the gastroesophageal junction. Probable continued SBO. Electronically Signed   By: Elsie StainJohn T Curnes M.D.   On: 01/02/2017 13:31   Dg Abd Portable 1v  Result Date: 01/02/2017 CLINICAL DATA:  Nausea and vomiting for 2 days. Patient status post laparoscopic appendectomy 12/28/2016. EXAM: PORTABLE ABDOMEN - 1 VIEW COMPARISON:  CT abdomen and pelvis 12/27/2016. FINDINGS: There is mild gaseous distention of the stomach and gaseous distention of small bowel loops up to 6.0 cm. Only a small volume of gas is seen the colon. Surgical drain in the right lower quadrant is noted. IMPRESSION: Bowel gas pattern most suggestive of small bowel obstruction rather than ileus. Electronically Signed   By: Drusilla Kannerhomas  Dalessio M.D.   On: 01/02/2017 11:33    Anti-infectives: Anti-infectives (From admission, onward)   Start     Dose/Rate Route Frequency Ordered Stop   12/28/16 0600  piperacillin-tazobactam (ZOSYN) IVPB 3.375 g     3.375 g 12.5 mL/hr over 240 Minutes Intravenous Every 8 hours 12/28/16 0048      12/28/16 0015  piperacillin-tazobactam (ZOSYN) IVPB 3.375 g     3.375 g 100 mL/hr over 30 Minutes Intravenous  Once 12/28/16 0002 12/28/16 0053       Assessment/Plan Acute perforated appendicitis with peritonitis POD #6- S/P laparoscopic appendectomy and placement of drain 12/28/16 by Dr. Fredricka Bonineonnor - afebrile, VSS, WBC 12.5 > 11.6 - previously tolerating diet but developed bilious emesis x2 11/29 - CT results show fluid-filled dilated proximal small bowel with transition to nondilated small bowel in left lower quadrant. No abscess noted.  - Signs/symptoms likely secondary to ileus following appendicitis and sx - Continue NG tube to LIWS and placement of PICC for TNA while awaiting ileus resolution -Continue IV abx  - Continue with ambulation as tolerated   Possible atelectasis/pneumonia - CT shows small pleural effusions and patchy consolidations on right which may reflect atelectasis or pneumonia - Continue incentive spirometry - pneumonia coverage with IV zosyn  FEN: NPO, IVF, NG tube to LIWS  ID: Zosyn 11/24 >> DVT: SCDs, SQ heparin   LOS: 6 days    Susy FrizzleMartha Lui Bellis , PA-S2  01/03/2017, 7:30 AM

## 2017-01-04 ENCOUNTER — Inpatient Hospital Stay (HOSPITAL_COMMUNITY): Payer: Self-pay

## 2017-01-04 LAB — COMPREHENSIVE METABOLIC PANEL
ALK PHOS: 60 U/L (ref 38–126)
ALT: 37 U/L (ref 17–63)
AST: 30 U/L (ref 15–41)
Albumin: 2 g/dL — ABNORMAL LOW (ref 3.5–5.0)
Anion gap: 6 (ref 5–15)
BUN: 13 mg/dL (ref 6–20)
CALCIUM: 8 mg/dL — AB (ref 8.9–10.3)
CO2: 28 mmol/L (ref 22–32)
CREATININE: 1.31 mg/dL — AB (ref 0.61–1.24)
Chloride: 104 mmol/L (ref 101–111)
GFR calc non Af Amer: 57 mL/min — ABNORMAL LOW (ref >60)
Glucose, Bld: 139 mg/dL — ABNORMAL HIGH (ref 65–99)
Potassium: 3.7 mmol/L (ref 3.5–5.1)
SODIUM: 138 mmol/L (ref 135–145)
Total Bilirubin: 0.7 mg/dL (ref 0.3–1.2)
Total Protein: 6 g/dL — ABNORMAL LOW (ref 6.5–8.1)

## 2017-01-04 LAB — CBC
HCT: 40.2 % (ref 39.0–52.0)
HCT: 40.6 % (ref 39.0–52.0)
HEMOGLOBIN: 12.6 g/dL — AB (ref 13.0–17.0)
HEMOGLOBIN: 13.6 g/dL (ref 13.0–17.0)
MCH: 29.3 pg (ref 26.0–34.0)
MCH: 31.6 pg (ref 26.0–34.0)
MCHC: 31.3 g/dL (ref 30.0–36.0)
MCHC: 33.5 g/dL (ref 30.0–36.0)
MCV: 93.5 fL (ref 78.0–100.0)
MCV: 94.2 fL (ref 78.0–100.0)
PLATELETS: 372 10*3/uL (ref 150–400)
PLATELETS: 354 10*3/uL (ref 150–400)
RBC: 4.3 MIL/uL (ref 4.22–5.81)
RBC: 4.31 MIL/uL (ref 4.22–5.81)
RDW: 15 % (ref 11.5–15.5)
RDW: 15.2 % (ref 11.5–15.5)
WBC: 11.5 10*3/uL — ABNORMAL HIGH (ref 4.0–10.5)
WBC: 11.1 10*3/uL — AB (ref 4.0–10.5)

## 2017-01-04 LAB — PHOSPHORUS: Phosphorus: 2.6 mg/dL (ref 2.5–4.6)

## 2017-01-04 LAB — MAGNESIUM: MAGNESIUM: 2.3 mg/dL (ref 1.7–2.4)

## 2017-01-04 LAB — TRIGLYCERIDES: Triglycerides: 124 mg/dL (ref <150)

## 2017-01-04 LAB — GLUCOSE, CAPILLARY
GLUCOSE-CAPILLARY: 152 mg/dL — AB (ref 65–99)
Glucose-Capillary: 132 mg/dL — ABNORMAL HIGH (ref 65–99)
Glucose-Capillary: 133 mg/dL — ABNORMAL HIGH (ref 65–99)
Glucose-Capillary: 142 mg/dL — ABNORMAL HIGH (ref 65–99)
Glucose-Capillary: 146 mg/dL — ABNORMAL HIGH (ref 65–99)
Glucose-Capillary: 154 mg/dL — ABNORMAL HIGH (ref 65–99)
Glucose-Capillary: 156 mg/dL — ABNORMAL HIGH (ref 65–99)
Glucose-Capillary: 159 mg/dL — ABNORMAL HIGH (ref 65–99)

## 2017-01-04 LAB — DIFFERENTIAL
BASOS PCT: 0 %
Basophils Absolute: 0 10*3/uL (ref 0.0–0.1)
EOS PCT: 3 %
Eosinophils Absolute: 0.3 10*3/uL (ref 0.0–0.7)
LYMPHS PCT: 18 %
Lymphs Abs: 2.1 10*3/uL (ref 0.7–4.0)
MONOS PCT: 7 %
Monocytes Absolute: 0.8 10*3/uL (ref 0.1–1.0)
NEUTROS ABS: 8.3 10*3/uL — AB (ref 1.7–7.7)
Neutrophils Relative %: 72 %

## 2017-01-04 LAB — PREALBUMIN: Prealbumin: 7.2 mg/dL — ABNORMAL LOW (ref 18–38)

## 2017-01-04 MED ORDER — TRAVASOL 10 % IV SOLN
INTRAVENOUS | Status: AC
  Administered 2017-01-04: 17:00:00 via INTRAVENOUS
  Filled 2017-01-04: qty 907.2

## 2017-01-04 MED ORDER — POTASSIUM CHLORIDE IN NACL 20-0.9 MEQ/L-% IV SOLN
INTRAVENOUS | Status: AC
  Administered 2017-01-04 – 2017-01-05 (×2): via INTRAVENOUS
  Filled 2017-01-04 (×4): qty 1000

## 2017-01-04 NOTE — Progress Notes (Signed)
PHARMACY - ADULT TOTAL PARENTERAL NUTRITION CONSULT NOTE   Pharmacy Consult:  TPN Indication:  Prolonged ileus  Patient Measurements: Height: 5\' 11"  (180.3 cm) Weight: 290 lb (131.5 kg) IBW/kg (Calculated) : 75.3 TPN AdjBW (KG): 89.4 Body mass index is 40.45 kg/m.  Assessment:  Tyler Franklin presented on 12/28/16 with abdominal pain that worsened after Thanksgiving.  Found to have acute appendicitis and was taken to the OR for appendectomy.  Patient tolerated diet advancement and then had projectile vomiting with green bile x2 on 01/02/17.  CT suggested obstruction.  Pharmacy consulted to manage TPN for prolonged ileus.  GI: baseline prealbumin 7.2, NG O/P down to 1750mL, drain O/P 35mL - PRN Zofran Endo: DM with A1c 7.2% - CBGs controlled Insulin requirements in the past 24 hours: 12 units Lytes: K+ 3.7 (goal >/= 4 for ileus), CL normalized, Phos/Na low normal, Mag WNL Renal: SCr 1.31, BUN WNL - UOP 0.3 ml/kg/hr, NS 20K at 75 ml/hr, net +6.1L since admit Pulm: stable on RA Cards: VSS Hepatobil: LFTs / tbili / TG WNL Neuro: PRN Dilaudid ID: Zosyn D#[id number] (11/24 >> ) for peritonitis, purulent - afebrile, WBC 11.5, no cultures TPN Access: PICC placed 01/03/17 TPN start date: 01/03/17   Nutritional Goals (per RD rec on 11/30): 2200-2500 kCal and 125-140 gm protein per day  Current Nutrition:  TPN   Plan:  Increase TPN to 60 ml/hr (goal rate 85 ml/hr).  Advance TPN to goal rate in AM pending CBGs. TPN will provide 1477 kCal and 91 gm of protein per day, meeting 67% of kCal and 73% of protein needs Electrolytes in TPN:  Increase K / Phos slightly, CL:AC 2:1 Daily multivitamin and trace elements in TPN Continue sensitive SSI Q4H Reduce NS 20K to 55 ml/hr when new TPN bag starts F/U AM labs   Adasia Hoar D. Laney Potashang, PharmD, BCPS Pager:  [phone] 01/04/2017, 7:41 AM

## 2017-01-04 NOTE — Progress Notes (Signed)
7 Days Post-Op   Subjective/Chief Complaint: Less pain more flatus    Objective: Vital signs in last 24 hours: Temp:  [98.2 F (36.8 C)-100 F (37.8 C)] 98.2 F (36.8 C) (12/01 0426) Pulse Rate:  [96-100] 96 (12/01 0426) Resp:  [18] 18 (12/01 0426) BP: (123-131)/(68-79) 123/68 (12/01 0426) SpO2:  [90 %-94 %] 90 % (12/01 0426) Last BM Date: 01/01/17  Intake/Output from previous day: 11/30 0701 - 12/01 0700 In: 2366.9 [P.O.:120; I.V.:2146.9; IV Piggyback:100] Out: 2435 [Urine:650; Emesis/NG output:1750; Drains:35] Intake/Output this shift: No intake/output data recorded.  Incision/Wound:soft less distended BS present   NGT    output down   JP    serous     Lab Results:  Recent Labs    01/04/17 0021 01/04/17 0442  WBC 11.1* 11.5*  HGB 12.6* 13.6  HCT 40.2 40.6  PLT 372 354   BMET Recent Labs    01/03/17 0854 01/04/17 0442  NA 135 138  K 3.5 3.7  CL 100* 104  CO2 28 28  GLUCOSE 131* 139*  BUN 14 13  CREATININE 1.35* 1.31*  CALCIUM 8.1* 8.0*   PT/INR No results for input(s): LABPROT, INR in the last 72 hours. ABG No results for input(s): PHART, HCO3 in the last 72 hours.  Invalid input(s): PCO2, PO2  Studies/Results: Ct Abdomen Pelvis W Contrast  Result Date: 01/02/2017 CLINICAL DATA:  Bilious emesis EXAM: CT ABDOMEN AND PELVIS WITH CONTRAST TECHNIQUE: Multidetector CT imaging of the abdomen and pelvis was performed using the standard protocol following bolus administration of intravenous contrast. CONTRAST:  100mL ISOVUE-300 IOPAMIDOL (ISOVUE-300) INJECTION 61% COMPARISON:  12/27/2016 FINDINGS: Lower chest: Lung bases demonstrate small pleural effusions. Patchy consolidation in the right middle lobe and bilateral lower lobes. Normal heart size. Hepatobiliary: Subcentimeter nonspecific hypodensity in the posterior right hepatic lobe. No calcified gallstones or biliary dilatation Pancreas: Unremarkable. No pancreatic ductal dilatation or surrounding  inflammatory changes. Spleen: Normal in size without focal abnormality. Adrenals/Urinary Tract: Adrenal glands are within normal limits. Multiple bilateral cysts in the kidneys, upper pole lesion on the left measures up to 12 cm. Bladder within normal limits Stomach/Bowel: Esophageal tube tip terminates in the proximal stomach. Dilated loops of small bowel measuring up to 4 cm. Transition to nondilated mid to distal small bowel, left lower quadrant, series 3, image number 68. Interval appendectomy Vascular/Lymphatic: No significant vascular findings are present. No enlarged abdominal or pelvic lymph nodes. Reproductive: Prostate is unremarkable. Other: Negative for free air or significant free fluid. Interim placement of a left lower quadrant abdominal drain with the tip terminating just deep to the umbilicus. Small fat in the umbilicus. Mild residual inflammatory changes in the right lower quadrant but significant improvement compared to prior. Musculoskeletal: Degenerative changes. No acute or suspicious bone lesion. IMPRESSION: 1. Multiple loops of fluid-filled dilated proximal to mid small bowel with transition to nondilated mid to distal small bowel in the left lower quadrant, suggesting an obstruction. 2. Interval appendectomy with decreased right lower quadrant inflammatory changes 3. Small pleural effusions and patchy consolidations in the right middle lobe and lower lobes which may reflect atelectasis or pneumonia. Electronically Signed   By: Jasmine PangKim  Fujinaga M.D.   On: 01/02/2017 20:32   Dg Abd Portable 1v  Result Date: 01/04/2017 CLINICAL DATA:  Ileus EXAM: PORTABLE ABDOMEN - 1 VIEW COMPARISON:  01/02/2017 FINDINGS: NG tube tip remains in the proximal stomach with the side port in the distal esophagus. Gaseous distention of bowel again noted, most pronounced involving the  small bowel concerning for small bowel obstruction. No real change since prior study. IMPRESSION: Continued dilated small bowel loops  concerning for small bowel obstruction. Electronically Signed   By: Charlett NoseKevin  Dover M.D.   On: 01/04/2017 08:29   Dg Abd Portable 1v  Result Date: 01/02/2017 CLINICAL DATA:  Feeding tube placement. EXAM: PORTABLE ABDOMEN - 1 VIEW COMPARISON:  01/02/2017. FINDINGS: Feeding tube tip lies at the gastroesophageal junction. Gaseous distention appears unchanged. IMPRESSION: Feeding tube tip lies at the gastroesophageal junction. Probable continued SBO. Electronically Signed   By: Elsie StainJohn T Curnes M.D.   On: 01/02/2017 13:31   Dg Abd Portable 1v  Result Date: 01/02/2017 CLINICAL DATA:  Nausea and vomiting for 2 days. Patient status post laparoscopic appendectomy 12/28/2016. EXAM: PORTABLE ABDOMEN - 1 VIEW COMPARISON:  CT abdomen and pelvis 12/27/2016. FINDINGS: There is mild gaseous distention of the stomach and gaseous distention of small bowel loops up to 6.0 cm. Only a small volume of gas is seen the colon. Surgical drain in the right lower quadrant is noted. IMPRESSION: Bowel gas pattern most suggestive of small bowel obstruction rather than ileus. Electronically Signed   By: Drusilla Kannerhomas  Dalessio M.D.   On: 01/02/2017 11:33    Anti-infectives: Anti-infectives (From admission, onward)   Start     Dose/Rate Route Frequency Ordered Stop   12/28/16 0600  piperacillin-tazobactam (ZOSYN) IVPB 3.375 g     3.375 g 12.5 mL/hr over 240 Minutes Intravenous Every 8 hours 12/28/16 0048     12/28/16 0015  piperacillin-tazobactam (ZOSYN) IVPB 3.375 g     3.375 g 100 mL/hr over 30 Minutes Intravenous  Once 12/28/16 0002 12/28/16 0053      Assessment/Plan: Acute perforated appendicitis with peritonitis POD #7- S/Plaparoscopic appendectomy and placement of drain11/24/18by Dr. Fredricka Bonineonnor - afebrile, VSS, WBC 12.5 > 11.6 - previously tolerating diet but developed bilious emesis x2 11/29 - CT results show fluid-filled dilated proximal small bowel with transition to nondilated small bowel in left lower quadrant. No  abscess noted.  - Signs/symptoms likely secondary to ileus following appendicitis and sx - Continue NG tube to LIWS and placement of PICC for TNA while awaiting ileus resolution -Continue IV abx  - Continue with ambulation as tolerated   Possible atelectasis/pneumonia - CT shows small pleural effusions and patchy consolidations on right which may reflect atelectasis or pneumonia - Continue incentive spirometry - pneumonia coverage with IV zosyn  FEN:NPO, IVF, NG tube to LIWS ID: Zosyn 11/24 >> DVT: SCDs, SQ heparin     LOS: 7 days    Maisie Fushomas A Recia Sons 01/04/2017

## 2017-01-05 LAB — BASIC METABOLIC PANEL
Anion gap: 8 (ref 5–15)
BUN: 17 mg/dL (ref 6–20)
CO2: 24 mmol/L (ref 22–32)
CREATININE: 1.31 mg/dL — AB (ref 0.61–1.24)
Calcium: 8.1 mg/dL — ABNORMAL LOW (ref 8.9–10.3)
Chloride: 106 mmol/L (ref 101–111)
GFR calc Af Amer: >60 mL/min (ref >60)
GFR calc non Af Amer: 57 mL/min — ABNORMAL LOW (ref >60)
GLUCOSE: 153 mg/dL — AB (ref 65–99)
Potassium: 3.8 mmol/L (ref 3.5–5.1)
Sodium: 138 mmol/L (ref 135–145)

## 2017-01-05 LAB — GLUCOSE, CAPILLARY
GLUCOSE-CAPILLARY: 198 mg/dL — AB (ref 65–99)
GLUCOSE-CAPILLARY: 181 mg/dL — AB (ref 65–99)
GLUCOSE-CAPILLARY: 176 mg/dL — AB (ref 65–99)
GLUCOSE-CAPILLARY: 178 mg/dL — AB (ref 65–99)
Glucose-Capillary: 161 mg/dL — ABNORMAL HIGH (ref 65–99)
Glucose-Capillary: 164 mg/dL — ABNORMAL HIGH (ref 65–99)
Glucose-Capillary: 212 mg/dL — ABNORMAL HIGH (ref 65–99)

## 2017-01-05 LAB — MAGNESIUM: Magnesium: 2.3 mg/dL (ref 1.7–2.4)

## 2017-01-05 LAB — PHOSPHORUS: Phosphorus: 2.1 mg/dL — ABNORMAL LOW (ref 2.5–4.6)

## 2017-01-05 MED ORDER — TRAVASOL 10 % IV SOLN
INTRAVENOUS | Status: DC
  Filled 2017-01-05: qty 1296

## 2017-01-05 MED ORDER — POTASSIUM CHLORIDE IN NACL 20-0.9 MEQ/L-% IV SOLN: INTRAVENOUS | Status: DC

## 2017-01-05 MED ORDER — TRAVASOL 10 % IV SOLN
INTRAVENOUS | Status: AC
  Administered 2017-01-05: 19:00:00 via INTRAVENOUS
  Filled 2017-01-05: qty 1296

## 2017-01-05 MED ORDER — SODIUM CHLORIDE 0.9 % IV SOLN
12.5000 mg | Freq: Four times a day (QID) | INTRAVENOUS | Status: DC | PRN
  Administered 2017-01-05 – 2017-01-06 (×2): 12.5 mg via INTRAVENOUS
  Filled 2017-01-05 (×2): qty 0.5

## 2017-01-05 MED ORDER — POTASSIUM CHLORIDE IN NACL 20-0.9 MEQ/L-% IV SOLN
INTRAVENOUS | Status: DC
  Administered 2017-01-06 – 2017-01-13 (×5): via INTRAVENOUS
  Filled 2017-01-05 (×6): qty 1000

## 2017-01-05 MED ORDER — SODIUM GLYCEROPHOSPHATE 1 MMOLE/ML IV SOLN
30.0000 mmol | Freq: Once | INTRAVENOUS | Status: DC
  Filled 2017-01-05: qty 30

## 2017-01-05 MED ORDER — DEXTROSE 5 % IV SOLN
20.0000 mmol | Freq: Once | INTRAVENOUS | Status: DC
  Filled 2017-01-05: qty 6.67

## 2017-01-05 NOTE — Progress Notes (Signed)
[facility] Progress Note  8 Days Post-Op  Subjective: CC: hiccoughs  Some flatus   Objective: Vital signs in last 24 hours: Temp:  [97.7 F (36.5 C)-98.6 F (37 C)] 98.6 F (37 C) (12/02 1007) Pulse Rate:  [83-111] 111 (12/02 1007) Resp:  [18] 18 (12/02 1007) BP: (125-164)/(71-81) 164/80 (12/02 1007) SpO2:  [90 %-92 %] 91 % (12/02 1007) Last BM Date: 01/04/17  Intake/Output from previous day: 12/01 0701 - 12/02 0700 In: 0  Out: 3385 [Urine:1350; Emesis/NG output:2000; Drains:35] Intake/Output this shift: No intake/output data recorded.  PE: Gen:  Alert, NAD, pleasant Card:  Regular rate and rhythm, pedal pulses 2+ BL Pulm:  Normal effort, clear to auscultation bilaterally Abd: Soft, non-tender, non-distended, bowel sounds present in all 4 quadrants, no HSM, incisions C/D/I Skin: warm and dry, no rashes  Psych: A&Ox3   Lab Results:  Recent Labs    01/04/17 0021 01/04/17 0442  WBC 11.1* 11.5*  HGB 12.6* 13.6  HCT 40.2 40.6  PLT 372 354   BMET Recent Labs    01/04/17 0442 01/05/17 0357  NA 138 138  K 3.7 3.8  CL 104 106  CO2 28 24  GLUCOSE 139* 153*  BUN 13 17  CREATININE 1.31* 1.31*  CALCIUM 8.0* 8.1*   PT/INR No results for input(s): LABPROT, INR in the last 72 hours. CMP     Component Value Date/Time   NA 138 01/05/2017 0357   K 3.8 01/05/2017 0357   CL 106 01/05/2017 0357   CO2 24 01/05/2017 0357   GLUCOSE 153 (H) 01/05/2017 0357   BUN 17 01/05/2017 0357   CREATININE 1.31 (H) 01/05/2017 0357   CALCIUM 8.1 (L) 01/05/2017 0357   PROT 6.0 (L) 01/04/2017 0442   ALBUMIN 2.0 (L) 01/04/2017 0442   AST 30 01/04/2017 0442   ALT 37 01/04/2017 0442   ALKPHOS 60 01/04/2017 0442   BILITOT 0.7 01/04/2017 0442   GFRNONAA 57 (L) 01/05/2017 0357   GFRAA >60 01/05/2017 0357   Lipase     Component Value Date/Time   LIPASE 15 12/27/2016 2015       Studies/Results: Dg Abd Portable 1v  Result Date: 01/04/2017 CLINICAL DATA:  Ileus  EXAM: PORTABLE ABDOMEN - 1 VIEW COMPARISON:  01/02/2017 FINDINGS: NG tube tip remains in the proximal stomach with the side port in the distal esophagus. Gaseous distention of bowel again noted, most pronounced involving the small bowel concerning for small bowel obstruction. No real change since prior study. IMPRESSION: Continued dilated small bowel loops concerning for small bowel obstruction. Electronically Signed   By: Charlett NoseKevin  Dover M.D.   On: 01/04/2017 08:29    Anti-infectives: Anti-infectives (From admission, onward)   Start     Dose/Rate Route Frequency Ordered Stop   12/28/16 0600  piperacillin-tazobactam (ZOSYN) IVPB 3.375 g     3.375 g 12.5 mL/hr over 240 Minutes Intravenous Every 8 hours 12/28/16 0048     12/28/16 0015  piperacillin-tazobactam (ZOSYN) IVPB 3.375 g     3.375 g 100 mL/hr over 30 Minutes Intravenous  Once 12/28/16 0002 12/28/16 0053       Assessment/Plan Acute perforated appendicitis with peritonitis POD #8- S/Plaparoscopic appendectomy and placement of drain11/24/18by Dr. Fredricka Bonineonnor - afebrile, VSS, WBC11.1 > 11.5 - previously tolerating diet but developed bilious emesisx211/29 - CT results show fluid-filled dilated proximal small bowel with transition to nondilated small bowel in left lower quadrant.No abscess noted.  - Signs/symptoms likely secondary to ileus following appendicitis and sx -ContinueNG  [facility] of PICC for TNAwhile awaiting ileus resolution -Continue IV abx  - Continue with ambulationas tolerated  add th Possible atelectasis/pneumonia - CT shows small pleural effusions and patchy consolidations on right which may reflect atelectasis or pneumonia -Continue incentive spirometry - pneumonia coverage with IV zosyn  FEN:NPO, IVF, NG clamp ID: Zosyn 11/24 >> DVT: SCDs, SQ heparin   LOS: 8 days    Susy FrizzleMartha Hunt , PA-S2  01/05/2017, 10:20 AM

## 2017-01-05 NOTE — Progress Notes (Signed)
PHARMACY - ADULT TOTAL PARENTERAL NUTRITION CONSULT NOTE   Pharmacy Consult:  TPN Indication:  Prolonged ileus  Patient Measurements: Height: 5\' 11"  (180.3 cm) Weight: 290 lb (131.5 kg) IBW/kg (Calculated) : 75.3 TPN AdjBW (KG): 89.4 Body mass index is 40.45 kg/m.  Assessment:  [age] YOM presented on 12/28/16 with abdominal pain that worsened after Thanksgiving.  Found to have acute appendicitis and was taken to the OR for appendectomy.  Patient tolerated diet advancement and then had projectile vomiting with green bile x2 on 01/02/17.  CT suggested obstruction.  Pharmacy consulted to manage TPN for prolonged ileus.  GI: baseline prealbumin 7.2, NG O/P down to 1200mL, drain O/P 35mL - PRN Zofran Endo: DM with A1c 7.2%, not on med PTA - CBGs trending up but remain < 180 Insulin requirements in the past 24 hours: 20 units Lytes: K+ 3.8 (goal >/= 4 for ileus), Phos 2.1, others WNL Renal: SCr 1.31, BUN WNL - UOP 0.4 ml/kg/hr, NS 20K at 55 ml/hr, net +3.6L since admit Pulm: stable on RA Cards: VSS Hepatobil: LFTs / tbili / TG WNL Neuro: PRN Dilaudid ID: Zosyn D#9 (11/24 >> ) for peritonitis, purulent - afebrile, WBC 11.5, no cultures TPN Access: PICC placed 01/03/17 TPN start date: 01/03/17   Nutritional Goals (per RD rec on 11/30): 2200-2500 kCal and 125-140 gm protein per day  Current Nutrition:  TPN   Plan:  Increase TPN to goal rate of 90 ml/hr TPN will provide 2285 kCal and 130 gm of protein per day, meeting 100% of patient's needs Electrolytes in TPN:  increase K / Phos slightly, Cl:Ac 2:1 Daily multivitamin and trace elements in TPN Continue resistant SSI Q4H.  Add 10 units regular insulin in TPN given significant SSI use and TPN rate increase. Reduce NS 20K to 25 ml/hr when new TPN bag starts KPhos 20 mmol IV x 1 F/U AM labs, CBGs   Romonia Yanik D. Laney Potashang, PharmD, BCPS Pager:  [phone] 01/05/2017, 7:54 AM

## 2017-01-06 ENCOUNTER — Inpatient Hospital Stay (HOSPITAL_COMMUNITY): Payer: Self-pay

## 2017-01-06 DIAGNOSIS — K3589 Other acute appendicitis without perforation or gangrene: Secondary | ICD-10-CM

## 2017-01-06 DIAGNOSIS — J9601 Acute respiratory failure with hypoxia: Secondary | ICD-10-CM

## 2017-01-06 DIAGNOSIS — J189 Pneumonia, unspecified organism: Secondary | ICD-10-CM

## 2017-01-06 DIAGNOSIS — I2602 Saddle embolus of pulmonary artery with acute cor pulmonale: Secondary | ICD-10-CM

## 2017-01-06 LAB — COMPREHENSIVE METABOLIC PANEL
ALT: 40 U/L (ref 17–63)
AST: 27 U/L (ref 15–41)
Albumin: 2.1 g/dL — ABNORMAL LOW (ref 3.5–5.0)
Alkaline Phosphatase: 56 U/L (ref 38–126)
Anion gap: 5 (ref 5–15)
BUN: 20 mg/dL (ref 6–20)
CHLORIDE: 113 mmol/L — AB (ref 101–111)
CO2: 23 mmol/L (ref 22–32)
CREATININE: 1.31 mg/dL — AB (ref 0.61–1.24)
Calcium: 8.1 mg/dL — ABNORMAL LOW (ref 8.9–10.3)
GFR calc non Af Amer: 57 mL/min — ABNORMAL LOW (ref >60)
Glucose, Bld: 189 mg/dL — ABNORMAL HIGH (ref 65–99)
Potassium: 4.1 mmol/L (ref 3.5–5.1)
SODIUM: 141 mmol/L (ref 135–145)
Total Bilirubin: 0.6 mg/dL (ref 0.3–1.2)
Total Protein: 6 g/dL — ABNORMAL LOW (ref 6.5–8.1)

## 2017-01-06 LAB — DIFFERENTIAL
BASOS PCT: 0 %
Basophils Absolute: 0 10*3/uL (ref 0.0–0.1)
EOS ABS: 0.2 10*3/uL (ref 0.0–0.7)
Eosinophils Relative: 1 %
LYMPHS ABS: 2.2 10*3/uL (ref 0.7–4.0)
LYMPHS PCT: 12 %
Monocytes Absolute: 1.1 10*3/uL — ABNORMAL HIGH (ref 0.1–1.0)
Monocytes Relative: 6 %
NEUTROS ABS: 15 10*3/uL — AB (ref 1.7–7.7)
NEUTROS PCT: 81 %

## 2017-01-06 LAB — CBC
HEMATOCRIT: 41.7 % (ref 39.0–52.0)
HEMOGLOBIN: 13.7 g/dL (ref 13.0–17.0)
MCH: 30.4 pg (ref 26.0–34.0)
MCHC: 32.9 g/dL (ref 30.0–36.0)
MCV: 92.7 fL (ref 78.0–100.0)
Platelets: 331 10*3/uL (ref 150–400)
RBC: 4.5 MIL/uL (ref 4.22–5.81)
RDW: 15.2 % (ref 11.5–15.5)
WBC: 18.5 10*3/uL — ABNORMAL HIGH (ref 4.0–10.5)

## 2017-01-06 LAB — GLUCOSE, CAPILLARY
GLUCOSE-CAPILLARY: 184 mg/dL — AB (ref 65–99)
GLUCOSE-CAPILLARY: 143 mg/dL — AB (ref 65–99)
GLUCOSE-CAPILLARY: 168 mg/dL — AB (ref 65–99)
Glucose-Capillary: 138 mg/dL — ABNORMAL HIGH (ref 65–99)
Glucose-Capillary: 179 mg/dL — ABNORMAL HIGH (ref 65–99)
Glucose-Capillary: 204 mg/dL — ABNORMAL HIGH (ref 65–99)
Glucose-Capillary: 211 mg/dL — ABNORMAL HIGH (ref 65–99)

## 2017-01-06 LAB — MAGNESIUM: Magnesium: 2.2 mg/dL (ref 1.7–2.4)

## 2017-01-06 LAB — PHOSPHORUS: PHOSPHORUS: 2.6 mg/dL (ref 2.5–4.6)

## 2017-01-06 LAB — TRIGLYCERIDES: Triglycerides: 115 mg/dL (ref <150)

## 2017-01-06 LAB — PREALBUMIN: PREALBUMIN: 9.3 mg/dL — AB (ref 18–38)

## 2017-01-06 MED ORDER — IOPAMIDOL (ISOVUE-370) INJECTION 76%
INTRAVENOUS | Status: AC
  Administered 2017-01-06: 100 mL
  Filled 2017-01-06: qty 100

## 2017-01-06 MED ORDER — HEPARIN (PORCINE) IN NACL 100-0.45 UNIT/ML-% IJ SOLN
2100.0000 [IU]/h | INTRAMUSCULAR | Status: DC
  Administered 2017-01-06: 1600 [IU]/h via INTRAVENOUS
  Filled 2017-01-06 (×5): qty 250

## 2017-01-06 MED ORDER — TRAVASOL 10 % IV SOLN
INTRAVENOUS | Status: AC
  Administered 2017-01-06: 17:00:00 via INTRAVENOUS
  Filled 2017-01-06: qty 1296

## 2017-01-06 MED ORDER — HEPARIN BOLUS VIA INFUSION
5000.0000 [IU] | Freq: Once | INTRAVENOUS | Status: AC
  Administered 2017-01-06: 5000 [IU] via INTRAVENOUS
  Filled 2017-01-06: qty 5000

## 2017-01-06 NOTE — Progress Notes (Signed)
Pt alert, oriented x4. Has RUL/RML PE. Lungs clear to auscultation. Heparin drip runs at 1416ml/hr. Latest V/S BP 118/81, P 114,RR 23, Sats 98% in NRM. Will give report to receiving RN.

## 2017-01-06 NOTE — Progress Notes (Signed)
Pt with PE on CT Transfer to ICU  Start heparin gtt stat NPO

## 2017-01-06 NOTE — Progress Notes (Signed)
Notified by nurse of patient acute desaturation to the 80's with ambulation. Associated diaphoresis.  When I came up to examine pt respiratory was in the room at bedside. pts sats had increased to 92% on NRB.  Patient denies SOB and was in no acute distress.  Diminished breath sounds right lung base, otherwise CTAB.  Stat EKG showed sinus tachycardia.  I have ordered a stat CTA chest to R/O PE.   Hosie SpangleElizabeth Simaan, [facility] Hospital[facility]C [facility] Pager: [phone] Consults: [phone] Mon-Fri 7:00 am-4:30 pm Sat-Sun 7:00 am-11:30 am

## 2017-01-06 NOTE — Progress Notes (Signed)
[facility] Progress Note  9 Days Post-Op  Subjective: CC:  Reports 2 BMs since yesterday and having flatus. Still endorses some distention. Pulling ~1000 cc on IS. Not mobilizing much. Denies SOB, productive cough, dysuria.   NGT 300 cc/24h  HR 109-118 bpm, afebrile, WBC 18 from 11 two days ago.  Objective: Vital signs in last 24 hours: Temp:  [97.7 F (36.5 C)-98.9 F (37.2 C)] 98.4 F (36.9 C) (12/03 0526) Pulse Rate:  [109-118] 114 (12/03 0526) Resp:  [18-20] 20 (12/03 0526) BP: (119-164)/(56-86) 119/71 (12/03 0526) SpO2:  [77 %-93 %] 91 % (12/03 0526) Last BM Date: 01/05/17  Intake/Output from previous day: 12/02 0701 - 12/03 0700 In: 3370.8 [P.O.:360; I.V.:2810.8; IV Piggyback:200] Out: 1275 [Urine:880; Emesis/NG output:300; Drains:95] Intake/Output this shift: No intake/output data recorded.  PE: Gen:  Alert, NAD, pleasant Card:  Regular rate and rhythm, pedal pulses 2+ BL Pulm:  Normal effort, clear to auscultation bilaterally Abd: Soft, non-tender, mild distention, bowel sounds present, incisions C/D/I  JP drain95 cc/24h, serous  Skin: warm and dry, no rashes  Psych: A&Ox3   Lab Results:  Recent Labs    01/04/17 0442 01/06/17 0339  WBC 11.5* 18.5*  HGB 13.6 13.7  HCT 40.6 41.7  PLT 354 331   BMET Recent Labs    01/05/17 0357 01/06/17 0339  NA 138 141  K 3.8 4.1  CL 106 113*  CO2 24 23  GLUCOSE 153* 189*  BUN 17 20  CREATININE 1.31* 1.31*  CALCIUM 8.1* 8.1*   PT/INR No results for input(s): LABPROT, INR in the last 72 hours. CMP     Component Value Date/Time   NA 141 01/06/2017 0339   K 4.1 01/06/2017 0339   CL 113 (H) 01/06/2017 0339   CO2 23 01/06/2017 0339   GLUCOSE 189 (H) 01/06/2017 0339   BUN 20 01/06/2017 0339   CREATININE 1.31 (H) 01/06/2017 0339   CALCIUM 8.1 (L) 01/06/2017 0339   PROT 6.0 (L) 01/06/2017 0339   ALBUMIN 2.1 (L) 01/06/2017 0339   AST 27 01/06/2017 0339   ALT 40 01/06/2017 0339   ALKPHOS 56  01/06/2017 0339   BILITOT 0.6 01/06/2017 0339   GFRNONAA 57 (L) 01/06/2017 0339   GFRAA >60 01/06/2017 0339   Lipase     Component Value Date/Time   LIPASE 15 12/27/2016 2015   Studies/Results: Dg Abd Portable 1v  Result Date: 01/04/2017 CLINICAL DATA:  Ileus EXAM: PORTABLE ABDOMEN - 1 VIEW COMPARISON:  01/02/2017 FINDINGS: NG tube tip remains in the proximal stomach with the side port in the distal esophagus. Gaseous distention of bowel again noted, most pronounced involving the small bowel concerning for small bowel obstruction. No real change since prior study. IMPRESSION: Continued dilated small bowel loops concerning for small bowel obstruction. Electronically Signed   By: Charlett NoseKevin  Dover M.D.   On: 01/04/2017 08:29   Anti-infectives: Anti-infectives (From admission, onward)   Start     Dose/Rate Route Frequency Ordered Stop   12/28/16 0600  piperacillin-tazobactam (ZOSYN) IVPB 3.375 g     3.375 g 12.5 mL/hr over 240 Minutes Intravenous Every 8 hours 12/28/16 0048     12/28/16 0015  piperacillin-tazobactam (ZOSYN) IVPB 3.375 g     3.375 g 100 mL/hr over 30 Minutes Intravenous  Once 12/28/16 0002 12/28/16 0053     Assessment/Plan Acute perforated appendicitis with peritonitis POD #9- S/Plaparoscopic appendectomy and placement of drain11/24/18by Dr. Fredricka Bonineonnor - afebrile, VSS, NWG95.6WBC18.5 from 11.5 - CT 11/29 fluid-filled dilated  proximal small bowel with transition zone in LLQ.No abscess noted.  - NGT replaced 11/29 for ileus/emesis - ileus resolving clinically and mildly improved on AXR - still some gaseos distention of SB. D/C NGT and allow ice chips/sips from floor  -Continue IV abx  - Continue with ambulationas tolerated   Possible atelectasis/pneumonia - CT shows small pleural effusions and patchy consolidations on right which may reflect atelectasis or pneumonia -Continue incentive spirometry - pneumonia coverage with IV zosyn   FEN:NPO, IVF; continue TNA today. May  be able to d/c or 1/2 rate tomorrow.  ID: Zosyn 11/24 >>; worsening leukocytosis - check CXR. May need to repeat CT abd this week.  DVT: SCDs, SQ heparin      LOS: 9 days    Adam PhenixElizabeth S Elika Godar , [facility] 01/06/2017, 7:36 AM Pager: [phone] Consults: [phone] Mon-Fri 7:00 am-4:30 pm Sat-Sun 7:00 am-11:30 am

## 2017-01-06 NOTE — Progress Notes (Signed)
Patient ID: Tyler OsierMichael Franklin, male   DOB: July 11, 1955, 61 y.o.   MRN: [id number] Conversant, tachycardic, not tachypneic, bp fine,sats ok on mask now, ct with noocclusive saddle pe, getting heparin bolus right now.  Discussed with ccm. Will be transferred to unit when bed ready

## 2017-01-06 NOTE — Progress Notes (Signed)
MD notified of CT angio results.  Ordered to start Heparin drip per pharmacy STAT and transfer to ICU.  Orders placed and will continue to monitor.  Hector ShadeMoss, Scotti Motter [place]

## 2017-01-06 NOTE — Consult Note (Signed)
PULMONARY / CRITICAL CARE MEDICINE   Name: Tyler Franklin MRN: [id number] DOB: August 16, 1955    ADMISSION DATE:  12/27/2016 CONSULTATION DATE:  12/3  REFERRING MD:  CCS Cornett  CHIEF COMPLAINT:  PE  HISTORY OF PRESENT ILLNESS:   61 year old male with PMH as below, which is significant for DM. He presented 12/23 with abdominal pain and was diagnosed with appendicitis and taken to OR. Appendix found to have perforated. This was repaired and drain was placed. Course complicated by Ileus and ATX vs PNA. Then 12/3 he had a sudden onset SOB and desaturation with ambulation. CT angiogram was performed and demonstrated PE with increased RV/LV ratio (2). PCCM called for further evaluation.   PAST MEDICAL HISTORY :  He  has a past medical history of Diabetes mellitus without complication (HCC).  PAST SURGICAL HISTORY: He  has a past surgical history that includes laparoscopic appendectomy (N/A, 12/28/2016).  No Known Allergies  No current facility-administered medications on file prior to encounter.    Current Outpatient Medications on File Prior to Encounter  Medication Sig  . Multiple Vitamin (ONE-A-DAY MENS PO) Take 1 tablet by mouth daily.    FAMILY HISTORY:  His has no family status information on file.    SOCIAL HISTORY: He  reports that  has never smoked. he has never used smokeless tobacco. He reports that he does not drink alcohol or use drugs.  REVIEW OF SYSTEMS:   Bolds are positive  Constitutional: weight loss, gain, night sweats, Fevers, chills, fatigue .  HEENT: headaches, Sore throat, sneezing, nasal congestion, post nasal drip, Difficulty swallowing, Tooth/dental problems, visual complaints visual changes, ear ache CV:  chest pain, radiates:,Orthopnea, PND, swelling in upper extremities, dizziness, palpitations, syncope.  GI  heartburn, indigestion, abdominal pain, nausea, vomiting, diarrhea, change in bowel habits, loss of appetite, bloody stools.  Resp: cough,  productive: , hemoptysis, dyspnea, chest pain, pleuritic.  Skin: rash or itching or icterus GU: dysuria, change in color of urine, urgency or frequency. flank pain, hematuria  MS: joint pain or swelling. decreased range of motion  Psych: change in mood or affect. depression or anxiety.  Neuro: difficulty with speech, weakness, numbness, ataxia    SUBJECTIVE:  Feeling better now that im resting.   VITAL SIGNS: BP 125/67 (BP Location: Left Arm)   Pulse (!) 118   Temp 98.8 F (37.1 C) (Oral)   Resp (!) 22   Ht 5\' 11"  (1.803 m)   Wt 131.5 kg (290 lb)   SpO2 99%   BMI 40.45 kg/m   HEMODYNAMICS:    VENTILATOR SETTINGS:    INTAKE / OUTPUT: I/O last 3 completed shifts: In: 3370.8 [P.O.:360; I.V.:2810.8; IV Piggyback:200] Out: 1505 [Urine:1080; Emesis/NG output:300; Drains:125]  PHYSICAL EXAMINATION: General:  Morbidly obese black male in NAD with NRB on Neuro:  Alert, oriented, non-focal HEENT:  [place]/AT, PERRL, no JVD Cardiovascular:  Tachy, regular, no MRG Lungs:  Clear bilateral Abdomen:  Obese, soft, non-tender, non-distended. LLQ drain with clear yellow drainage. Musculoskeletal:  No acute deformity or ROM limitation Skin:  Grossly intact  LABS:  BMET Recent Labs  Lab 01/04/17 0442 01/05/17 0357 01/06/17 0339  NA 138 138 141  K 3.7 3.8 4.1  CL 104 106 113*  CO2 28 24 23   BUN 13 17 20   CREATININE 1.31* 1.31* 1.31*  GLUCOSE 139* 153* 189*    Electrolytes Recent Labs  Lab 01/04/17 0442 01/05/17 0357 01/06/17 0339  CALCIUM 8.0* 8.1* 8.1*  MG 2.3 2.3 2.2  PHOS 2.6 2.1* 2.6    CBC Recent Labs  Lab 01/04/17 0021 01/04/17 0442 01/06/17 0339  WBC 11.1* 11.5* 18.5*  HGB 12.6* 13.6 13.7  HCT 40.2 40.6 41.7  PLT 372 354 331    Coag's No results for input(s): APTT, INR in the last 168 hours.  Sepsis Markers No results for input(s): LATICACIDVEN, PROCALCITON, O2SATVEN in the last 168 hours.  ABG No results for input(s): PHART, PCO2ART, PO2ART in  the last 168 hours.  Liver Enzymes Recent Labs  Lab 01/04/17 0442 01/06/17 0339  AST 30 27  ALT 37 40  ALKPHOS 60 56  BILITOT 0.7 0.6  ALBUMIN 2.0* 2.1*    Cardiac Enzymes No results for input(s): TROPONINI, PROBNP in the last 168 hours.  Glucose Recent Labs  Lab 01/06/17 0003 01/06/17 0407 01/06/17 0751 01/06/17 1142 01/06/17 1314 01/06/17 1553  GLUCAP 161* 204* 184* 138* 143* 211*    Imaging Dg Abd 1 View  Result Date: 01/06/2017 CLINICAL DATA:  Ileus, clinical improvement. EXAM: ABDOMEN - 1 VIEW COMPARISON:  Supine abdominal radiographs of January 04, 2017 FINDINGS: There remain loops of moderately distended gas-filled small bowel. The degree of distention appears stable. There is no significant large bowel gas observed. The abdominal and pelvic drainage catheter is in stable position. The NG tube tip is faintly visible in the upper aspect of the image but is not in appropriate position. IMPRESSION: Fairly stable appearance of the small bowel distention compatible with an ileus. The esophagogastric tube has been withdrawn and advancement by at least 15-20 cm is recommended. Electronically Signed   By: David  Swazil[place] M.D.   On: 01/06/2017 08:11   Ct Angio Chest Pe W Or Wo Contrast  Result Date: 01/06/2017 CLINICAL DATA:  Dyspnea, tachycardia EXAM: CT ANGIOGRAPHY CHEST WITH CONTRAST TECHNIQUE: Multidetector CT imaging of the chest was performed using the standard protocol during bolus administration of intravenous contrast. Multiplanar CT image reconstructions and MIPs were obtained to evaluate the vascular anatomy. CONTRAST:  100mL ISOVUE-370 IOPAMIDOL (ISOVUE-370) INJECTION 76% COMPARISON:  None. FINDINGS: Cardiovascular: Satisfactory opacification of the pulmonary arteries to the segmental level. Nonocclusive saddle pulmonary embolus. Bilateral upper lobe lobar pulmonary emboli. Bilateral lower lobe segmental and subsegmental pulmonary emboli. Normal heart size. No pericardial  effusion. Right heart strain. Mediastinum/Nodes: No enlarged mediastinal, hilar, or axillary lymph nodes. Thyroid gland, trachea, and esophagus demonstrate no significant findings. Lungs/Pleura: Right upper lobe, right middle lobe, right lower lobe and left lower lobe airspace disease. No pleural effusion or pneumothorax. Upper Abdomen: Multiple small bowel air-fluid levels partially visualized. Again noted is a left renal cyst. Musculoskeletal: No acute osseous abnormality. Review of the MIP images confirms the above findings. IMPRESSION: 1. Examination is positive for acute pulmonary embolus. Positive for acute PE with CT evidence of right heart strain (RV/LV Ratio = 2) consistent with at least submassive (intermediate risk) PE. The presence of right heart strain has been associated with an increased risk of morbidity and mortality. Please activate Code PE by paging [phone]. 2. Right upper lobe, right middle lobe, right lower lobe and left lower lobe airspace disease. Findings concerning for multilobar pneumonia. 3. Multiple small bowel air-fluid levels partially visualized consistent with known small bowel obstruction. Critical Value/emergent results were called by telephone at the time of interpretation on 01/06/2017 at 5:17 pm to Nurse Lanna PocheLindsey Moss , who verbally acknowledged these results. Electronically Signed   By: Elige KoHetal  Patel   On: 01/06/2017 17:20   Dg Chest Port 1 [address]  Result Date: 01/06/2017 CLINICAL DATA:  Leukocytosis. EXAM: PORTABLE CHEST 1 VIEW COMPARISON:  CT abdomen and pelvis including lung bases January 02, 2017 FINDINGS: Central catheter tip is in the superior vena cava. No pneumothorax. There is airspace consolidation in the right lower lung zone with small right pleural effusion. There is atelectatic change left base. Lungs elsewhere clear. Heart size and pulmonary vascularity are normal. No adenopathy. There is degenerative change in the thoracic spine. IMPRESSION: Increase in  consolidation right base with small right pleural effusion. Left base atelectasis noted. Heart size within normal limits. Central catheter tip in superior vena cava. No pneumothorax. Electronically Signed   By: Bretta BangWilliam  Woodruff III M.D.   On: 01/06/2017 08:47     STUDIES:  CTA chest 12/3 > Examination is positive for acute pulmonary embolus. Positive for acute PE with CT  evidence of right heart strain (RV/LV Ratio = 2). Right upper lobe, right middle lobe, right lower lobe and left lower lobe airspace disease. Findings concerning for multilobar pneumonia. Multiple small bowel air-fluid levels partially visualized consistent with known small bowel obstruction.  CULTURES: Sputum [no date] >>>  ANTIBIOTICS: Zosyn 11/23 >  SIGNIFICANT EVENTS: 11/23 admit with appendicitis to OR Ileus 12/3 PE, transfer to ICU.  LINES/TUBES: PICC RUE >>>  ASSESSMENT / PLAN:  Acute hypoxemic respiratory failure secondary to pulmonary embolism: RV/LV ratio 2.0 by CT. Had been on subq heparin, now on infusion. Transferring to ICU per [facility].   Plan: Telemetry monitoring Supplemental O2 wean FiO2 to keep SpO2 > 90% Heparin infusion per pharmacy Echocardiogram Doppler bilateral upper an lower extremities.  High risk for thrombolytic therapy given surgery 11/23, EKOS may be reasonable if indicated.  Appendicitis with perforation Ileus - per [facility] - on TPN - Drain in place  ATX vs PNA - Continue zosyn - Follow CXR - Check PCT  AKI  - gentle hydration - Follow BMP - I&O  Joneen RoachPaul Cova Knieriem, AGACNP-BC [facility] Pager [phone] or [phone]  01/06/2017 8:10 PM

## 2017-01-06 NOTE — Progress Notes (Signed)
PHARMACY - ADULT TOTAL PARENTERAL NUTRITION CONSULT NOTE   Pharmacy Consult:  TPN Indication:  Prolonged ileus  Patient Measurements: Height: 5\' 11"  (180.3 cm) Weight: 290 lb (131.5 kg) IBW/kg (Calculated) : 75.3 TPN AdjBW (KG): 89.4 Body mass index is 40.45 kg/m.  Assessment:  [age] YOM presented on 12/28/16 with abdominal pain that worsened after Thanksgiving.  Found to have acute appendicitis and was taken to the OR for appendectomy.  Patient tolerated diet advancement and then had projectile vomiting with green bile x2 on 01/02/17.  CT suggested obstruction.  Pharmacy consulted to manage TPN for prolonged ileus.  GI: baseline prealbumin 7.2, NG O/P down to 1100mL, drain O/P 95mL, LBM 12/2, +flatus - PRN Zofran Endo: DM with A1c 7.2%, not on med PTA - CBGs trended up with TPN rate increase Insulin requirements in the past 24 hours: 27 units Lytes: CL slightly high, others WNL (Phos up to 2.6 and KPhos dose not charted as given 12/2) Renal: SCr 1.31, BUN WNL - UOP 0.3 ml/kg/hr, NS 20K at 25 ml/hr, net +4.8L since admit Pulm: stable on RA Cards: BP controlled, tachy Hepatobil: LFTs / tbili / TG WNL Neuro: PRN Dilaudid / chlorpromazine ID: Zosyn D#10 (11/24 >> ) for peritonitis, purulent - afebrile, WBC 11.5, no cultures TPN Access: PICC placed 01/03/17 TPN start date: 01/03/17   Nutritional Goals (per RD rec on 11/30): 2200-2500 kCal and 125-140 gm protein per day  Current Nutrition:  TPN   Plan:  Continue TPN at 90 ml/hr TPN provides 2285 kCal and 130 gm of protein per day, meeting 100% of patient's needs Electrolytes in TPN:  increase Phos, Cl:Ac changed to 1:2 Daily multivitamin and trace elements in TPN Continue resistant SSI Q4H + increase regular insulin in TPN to 25 units Continue NS 20K at 25 ml/hr F/U CBGs, ability to start orals, abx LOT   Wissam Resor D. Laney Potashang, PharmD, BCPS Pager:  [phone] 01/06/2017, 7:54 AM

## 2017-01-06 NOTE — Progress Notes (Addendum)
At 1310, RN was called into room by Diplomatic Services operational officersecretary.  Upon entering pt's room, he was sitting in a wheelchair with another RN and 2 PT's with him.  Pt's sister stated that they were ambulating in the hallway when he began to get very tired.  Pt and sister started to return to room and sister stated that pt became very weak and began to sweat.  The PT in room had taken VS and pt was 78% on 4L,  BP was 99/77 and HR 138.  Pt also still diaphoretic.  CBG checked - 143.  PA notified and ordered EKG and to increase O2.  Pt remained in the 80's on 5L and came up to 93-94% on NRB. EKG done by NT.  RT called and also came to assess pt.  While RT and RN in room, PA also came to assess pt.  CT angio ordered.  Will continue to monitor pt.  Hector ShadeMoss, Jasiri Han[place]alt [place]

## 2017-01-06 NOTE — Progress Notes (Signed)
ANTICOAGULATION CONSULT NOTE - Initial Consult  Pharmacy Consult for Heparin Indication: pulmonary embolus  No Known Allergies  Patient Measurements: Height: 5\' 11"  (180.3 cm) Weight: 290 lb (131.5 kg) IBW/kg (Calculated) : 75.3 Heparin Dosing Weight: 105 kg  Vital Signs: Temp: 98.8 F (37.1 C) (12/03 1448) Temp Source: Oral (12/03 1448) BP: 125/67 (12/03 1741) Pulse Rate: 118 (12/03 1741)  Labs: Recent Labs    01/04/17 0021 01/04/17 0442 01/05/17 0357 01/06/17 0339  HGB 12.6* 13.6  --  13.7  HCT 40.2 40.6  --  41.7  PLT 372 354  --  331  CREATININE  --  1.31* 1.31* 1.31*    Estimated Creatinine Clearance: 81.9 mL/min (A) (by C-G formula based on SCr of 1.31 mg/dL (H)).  Assessment:   61 yr old male known to Pharmacy from TPN management.    New bilateral PE, non-occlusive saddle PE per CTA. To begin IV heparin stat.   Has been on heparin 5000 units sq q8hrs since 11/24.  Last dose ~2pm.    Goal of Therapy:  Heparin level 0.3-0.7 units/ml Monitor platelets by anticoagulation protocol: Yes   Plan:   Discontinue sq heparin.  Heparin 5000 units IV x 1  Heparin drip to begin at 1600 units/hr  Heparin level ~6 hrs after drip begins.  Daily heparin level and CBC.  Dennie Fettersgan, Laikynn Pollio [place], [place] Pager: [phone] 01/06/2017,6:00 PM

## 2017-01-07 ENCOUNTER — Inpatient Hospital Stay (HOSPITAL_COMMUNITY): Payer: Self-pay

## 2017-01-07 DIAGNOSIS — R000 Tachycardia, unspecified: Secondary | ICD-10-CM

## 2017-01-07 DIAGNOSIS — I361 Nonrheumatic tricuspid (valve) insufficiency: Secondary | ICD-10-CM

## 2017-01-07 LAB — GLUCOSE, CAPILLARY
GLUCOSE-CAPILLARY: 187 mg/dL — AB (ref 65–99)
GLUCOSE-CAPILLARY: 195 mg/dL — AB (ref 65–99)
GLUCOSE-CAPILLARY: 178 mg/dL — AB (ref 65–99)
Glucose-Capillary: 189 mg/dL — ABNORMAL HIGH (ref 65–99)
Glucose-Capillary: 180 mg/dL — ABNORMAL HIGH (ref 65–99)

## 2017-01-07 LAB — BASIC METABOLIC PANEL
ANION GAP: 8 (ref 5–15)
BUN: 20 mg/dL (ref 6–20)
CALCIUM: 8 mg/dL — AB (ref 8.9–10.3)
CO2: 23 mmol/L (ref 22–32)
Chloride: 108 mmol/L (ref 101–111)
Creatinine, Ser: 1.31 mg/dL — ABNORMAL HIGH (ref 0.61–1.24)
GFR, EST NON AFRICAN AMERICAN: 57 mL/min — AB (ref >60)
Glucose, Bld: 209 mg/dL — ABNORMAL HIGH (ref 65–99)
Potassium: 4.7 mmol/L (ref 3.5–5.1)
SODIUM: 139 mmol/L (ref 135–145)

## 2017-01-07 LAB — HEPARIN LEVEL (UNFRACTIONATED): Heparin Unfractionated: 0.86 IU/mL — ABNORMAL HIGH (ref 0.30–0.70)

## 2017-01-07 LAB — CBC
HEMATOCRIT: 40.2 % (ref 39.0–52.0)
Hemoglobin: 13 g/dL (ref 13.0–17.0)
MCH: 30.2 pg (ref 26.0–34.0)
MCHC: 32.3 g/dL (ref 30.0–36.0)
MCV: 93.5 fL (ref 78.0–100.0)
PLATELETS: 295 10*3/uL (ref 150–400)
RBC: 4.3 MIL/uL (ref 4.22–5.81)
RDW: 15.6 % — AB (ref 11.5–15.5)
WBC: 17.2 10*3/uL — AB (ref 4.0–10.5)

## 2017-01-07 LAB — ECHOCARDIOGRAM COMPLETE
Height: 71 in
WEIGHTICAEL: 4640 [oz_av]

## 2017-01-07 LAB — PROCALCITONIN: PROCALCITONIN: 0.37 ng/mL

## 2017-01-07 LAB — MRSA PCR SCREENING: MRSA by PCR: NEGATIVE

## 2017-01-07 MED ORDER — HEPARIN (PORCINE) IN NACL 100-0.45 UNIT/ML-% IJ SOLN
2400.0000 [IU]/h | INTRAMUSCULAR | Status: DC
  Administered 2017-01-07: 2400 [IU]/h via INTRAVENOUS
  Administered 2017-01-07: 2600 [IU]/h via INTRAVENOUS
  Filled 2017-01-07 (×2): qty 250

## 2017-01-07 MED ORDER — TRAVASOL 10 % IV SOLN
INTRAVENOUS | Status: AC
  Administered 2017-01-07: 18:00:00 via INTRAVENOUS
  Filled 2017-01-07: qty 1296

## 2017-01-07 MED ORDER — HEPARIN BOLUS VIA INFUSION
4000.0000 [IU] | Freq: Once | INTRAVENOUS | Status: AC
  Administered 2017-01-07: 4000 [IU] via INTRAVENOUS
  Filled 2017-01-07: qty 4000

## 2017-01-07 NOTE — Progress Notes (Signed)
ANTICOAGULATION CONSULT NOTE - Follow Up Consult  Pharmacy Consult for Heparin Indication: pulmonary embolus  No Known Allergies  Patient Measurements: Height: 5\' 11"  (180.3 cm) Weight: 290 lb (131.5 kg) IBW/kg (Calculated) : 75.3 Heparin Dosing Weight: 105 kg  Vital Signs: Temp: 99 F (37.2 C) (12/04 1512) Temp Source: Oral (12/04 1512) BP: 123/72 (12/04 1900) Pulse Rate: 107 (12/04 1900)  Labs: Recent Labs    01/05/17 0357 01/06/17 0339 01/07/17 0052 01/07/17 0526 01/07/17 0908 01/07/17 1825  HGB  --  13.7  --  13.0  --   --   HCT  --  41.7  --  40.2  --   --   PLT  --  331  --  295  --   --   HEPARINUNFRC  --   --  <0.10*  --  <0.10* 0.86*  CREATININE 1.31* 1.31*  --  1.31*  --   --     Estimated Creatinine Clearance: 81.9 mL/min (A) (by C-G formula based on SCr of 1.31 mg/dL (H)).  Assessment:  61 yr old male with new bilateral PE, non-occlusive saddle PE per CTA. Heparin level is above goal on 2600 units/hr    Goal of Therapy:  Heparin level 0.3-0.7 units/ml Monitor platelets by anticoagulation protocol: Yes   Plan:  -Decrease heparin to 2400 units/hr -Heparin level in 6 hours and daily wth CBC daily  Harland Germanndrew Ediel Unangst, Pharm D 01/07/2017 7:20 PM

## 2017-01-07 NOTE — Progress Notes (Signed)
PHARMACY - ADULT TOTAL PARENTERAL NUTRITION CONSULT NOTE   Pharmacy Consult:  TPN Indication:  Prolonged ileus  Patient Measurements: Height: 5\' 11"  (180.3 cm) Weight: 290 lb (131.5 kg) IBW/kg (Calculated) : 75.3 TPN AdjBW (KG): 89.4 Body mass index is 40.45 kg/m.  Assessment:  [age] YOM presented on 12/28/16 with abdominal pain that worsened after Thanksgiving.  Found to have acute appendicitis and was taken to the OR for appendectomy.  Patient tolerated diet advancement and then had projectile vomiting with green bile x2 on 01/02/17.  CT suggested obstruction.  Pharmacy consulted to manage TPN for prolonged ileus.  GI: On TPN for prolonged ileus. Albumin low at 2.1. Baseline prealbumin 7.2. NG tube pulled on 12/3. Drain O/P minimal with 30mL yesterday. LBM 12/2, +flatus - PRN Zofran Endo: DM with A1c 7.2%, not on med PTA - CBGs trended up with TPN rate increase (140-210s) Insulin requirements in the past 24 hours: 26 units of SSI + 25 units in TPN Lytes: wnl today (Phos up to 2.6 and KPhos dose not charted as given 12/2) CoCa 9.4. Renal: SCr stable 1.31, BUN wnl - UOP 0.3 ml/kg/hr, NS 20K at 25 ml/hr, net +4.8L since admit Pulm: stable on RA Cards: BP controlled, tachy. CT showed PE so started on heparin Hepatobil: LFTs / tbili / TG WNL Neuro: PRN Dilaudid / chlorpromazine ID: Zosyn D#11 (11/24 >> ) for peritonitis. Afebrile, WBC up to 17.2.  TPN Access: PICC placed 01/03/17 TPN start date: 01/03/17   Nutritional Goals (per RD rec on 11/30): 2200-2500 kCal and 125-140 gm protein per day  Current Nutrition:  Advanced to clears TPN  Plan:  Continue TPN at 8590ml/hr This TPN provides 130 g of protein, 367 g of dextrose, and 52 g of lipids for a total of 2,285 kcals meeting 100% of patient needs Electrolytes in TPN: continue current concentration, Cl:Ac 1:2 Add MVI, trace elements, and increase to 40 units of regular insulin in TPN Continue resistant SSI and adjust as  needed Continue NS with 20mEq of KCl at 6325ml/hr Monitor TPN labs F/U advancement of diet and ability to wean TPN   Enzo BiNathan Rokia Bosket, PharmD, BCPS Clinical Pharmacist Pager [phone] 01/07/2017 7:38 AM

## 2017-01-07 NOTE — Progress Notes (Signed)
10 Days Post-Op   Subjective/Chief Complaint: Looks better  Less SOB PE on CT  On heparin   Objective: Vital signs in last 24 hours: Temp:  [98 F (36.7 C)-100.4 F (38 C)] 100.4 F (38 C) (12/04 0720) Pulse Rate:  [100-138] 101 (12/04 0700) Resp:  [22-37] 22 (12/04 0700) BP: (90-126)/(62-81) 114/74 (12/04 0700) SpO2:  [79 %-100 %] 96 % (12/04 0731) Last BM Date: 01/05/17  Intake/Output from previous day: 12/03 0701 - 12/04 0700 In: 1490.7 [I.V.:1373.2; IV Piggyback:87.5] Out: 930 [Urine:900; Drains:30] Intake/Output this shift: Total I/O In: -  Out: 150 [Urine:150]  Incision/Wound:CDI distended abdomen but much less tight  Port site ok drain serous   Lab Results:  Recent Labs    01/06/17 0339 01/07/17 0526  WBC 18.5* 17.2*  HGB 13.7 13.0  HCT 41.7 40.2  PLT 331 295   BMET Recent Labs    01/06/17 0339 01/07/17 0526  NA 141 139  K 4.1 4.7  CL 113* 108  CO2 23 23  GLUCOSE 189* 209*  BUN 20 20  CREATININE 1.31* 1.31*  CALCIUM 8.1* 8.0*   PT/INR No results for input(s): LABPROT, INR in the last 72 hours. ABG No results for input(s): PHART, HCO3 in the last 72 hours.  Invalid input(s): PCO2, PO2  Studies/Results: Dg Abd 1 View  Result Date: 01/06/2017 CLINICAL DATA:  Ileus, clinical improvement. EXAM: ABDOMEN - 1 VIEW COMPARISON:  Supine abdominal radiographs of January 04, 2017 FINDINGS: There remain loops of moderately distended gas-filled small bowel. The degree of distention appears stable. There is no significant large bowel gas observed. The abdominal and pelvic drainage catheter is in stable position. The NG tube tip is faintly visible in the upper aspect of the image but is not in appropriate position. IMPRESSION: Fairly stable appearance of the small bowel distention compatible with an ileus. The esophagogastric tube has been withdrawn and advancement by at least 15-20 cm is recommended. Electronically Signed   By: David  SwazilandJordan M.D.   On:  01/06/2017 08:11   Ct Angio Chest Pe W Or Wo Contrast  Result Date: 01/06/2017 CLINICAL DATA:  Dyspnea, tachycardia EXAM: CT ANGIOGRAPHY CHEST WITH CONTRAST TECHNIQUE: Multidetector CT imaging of the chest was performed using the standard protocol during bolus administration of intravenous contrast. Multiplanar CT image reconstructions and MIPs were obtained to evaluate the vascular anatomy. CONTRAST:  100mL ISOVUE-370 IOPAMIDOL (ISOVUE-370) INJECTION 76% COMPARISON:  None. FINDINGS: Cardiovascular: Satisfactory opacification of the pulmonary arteries to the segmental level. Nonocclusive saddle pulmonary embolus. Bilateral upper lobe lobar pulmonary emboli. Bilateral lower lobe segmental and subsegmental pulmonary emboli. Normal heart size. No pericardial effusion. Right heart strain. Mediastinum/Nodes: No enlarged mediastinal, hilar, or axillary lymph nodes. Thyroid gland, trachea, and esophagus demonstrate no significant findings. Lungs/Pleura: Right upper lobe, right middle lobe, right lower lobe and left lower lobe airspace disease. No pleural effusion or pneumothorax. Upper Abdomen: Multiple small bowel air-fluid levels partially visualized. Again noted is a left renal cyst. Musculoskeletal: No acute osseous abnormality. Review of the MIP images confirms the above findings. IMPRESSION: 1. Examination is positive for acute pulmonary embolus. Positive for acute PE with CT evidence of right heart strain (RV/LV Ratio = 2) consistent with at least submassive (intermediate risk) PE. The presence of right heart strain has been associated with an increased risk of morbidity and mortality. Please activate Code PE by paging [phone]. 2. Right upper lobe, right middle lobe, right lower lobe and left lower lobe airspace disease. Findings concerning for  multilobar pneumonia. 3. Multiple small bowel air-fluid levels partially visualized consistent with known small bowel obstruction. Critical Value/emergent results  were called by telephone at the time of interpretation on 01/06/2017 at 5:17 pm to Nurse Lanna PocheLindsey Moss , who verbally acknowledged these results. Electronically Signed   By: Elige KoHetal  Patel   On: 01/06/2017 17:20   Dg Chest Port 1 View  Result Date: 01/06/2017 CLINICAL DATA:  Leukocytosis. EXAM: PORTABLE CHEST 1 VIEW COMPARISON:  CT abdomen and pelvis including lung bases January 02, 2017 FINDINGS: Central catheter tip is in the superior vena cava. No pneumothorax. There is airspace consolidation in the right lower lung zone with small right pleural effusion. There is atelectatic change left base. Lungs elsewhere clear. Heart size and pulmonary vascularity are normal. No adenopathy. There is degenerative change in the thoracic spine. IMPRESSION: Increase in consolidation right base with small right pleural effusion. Left base atelectasis noted. Heart size within normal limits. Central catheter tip in superior vena cava. No pneumothorax. Electronically Signed   By: Bretta BangWilliam  Woodruff III M.D.   On: 01/06/2017 08:47    Anti-infectives: Anti-infectives (From admission, onward)   Start     Dose/Rate Route Frequency Ordered Stop   12/28/16 0600  piperacillin-tazobactam (ZOSYN) IVPB 3.375 g     3.375 g 12.5 mL/hr over 240 Minutes Intravenous Every 8 hours 12/28/16 0048     12/28/16 0015  piperacillin-tazobactam (ZOSYN) IVPB 3.375 g     3.375 g 100 mL/hr over 30 Minutes Intravenous  Once 12/28/16 0002 12/28/16 0053      Assessment/Plan: s/p Procedure(s): APPENDECTOMY LAPAROSCOPIC (N/A) Patient Active Problem List   Diagnosis Date Noted  . Acute saddle pulmonary embolism with acute cor pulmonale (HCC)   . Acute respiratory failure with hypoxia (HCC)   . HCAP (healthcare-associated pneumonia)   . Acute appendicitis 12/28/2016    Looks better advance diet PE heparin GTT appreciate CCM help   LOS: 10 days    Tyler Franklin A Tyler Franklin 01/07/2017

## 2017-01-07 NOTE — Progress Notes (Signed)
  Echocardiogram 2D Echocardiogram has been performed.  Roosvelt MaserLane, Donnella Morford F 01/07/2017, 2:21 PM

## 2017-01-07 NOTE — Progress Notes (Addendum)
PULMONARY / CRITICAL CARE MEDICINE   Name: Tyler OsierMichael Franklin MRN: [id number] DOB: 03/14/1955    ADMISSION DATE:  12/27/2016 CONSULTATION DATE:  12/3  REFERRING MD:  CCS Cornett  CHIEF COMPLAINT:  PE  HISTORY OF PRESENT ILLNESS:   61 year old male with PMH as below, which is significant for DM. He presented 12/23 with abdominal pain and was diagnosed with appendicitis and taken to OR. Appendix found to have perforated. This was repaired and drain was placed. Course complicated by Ileus and ATX vs PNA. Then 12/3 he had a sudden onset SOB and desaturation with ambulation. CT angiogram was performed and demonstrated PE with increased RV/LV ratio (2). PCCM called for further evaluation.   SUBJECTIVE:  Feels better, RN reports O2 demand down to 5L [place]  VITAL SIGNS: BP 107/67   Pulse (!) 103   Temp (!) 100.4 F (38 C) (Axillary)   Resp (!) 31   Ht 5\' 11"  (1.803 m)   Wt 131.5 kg (290 lb)   SpO2 94%   BMI 40.45 kg/m    HEMODYNAMICS:    VENTILATOR SETTINGS:    INTAKE / OUTPUT: I/O last 3 completed shifts: In: 1490.7 [I.V.:1373.2; Other:30; IV Piggyback:87.5] Out: 1860 [Urine:1780; Drains:80]  PHYSICAL EXAMINATION: General:  Morbidly obese black male in NAD on 5L [place] Neuro:  Sleeping but otherwise, alert and interactive, moving all ext to command HEENT:  [place]/AT, PERRL, EOM-I and MMM Cardiovascular:  RRR, Nl S1/S2 and -M/R/G. Lungs:  CTA bilaterally Abdomen:  Obese, soft, non-tender, non-distended. LLQ drain with clear yellow drainage. Musculoskeletal:  No acute deformity or ROM limitation Skin:  Grossly intact  LABS:  BMET Recent Labs  Lab 01/05/17 0357 01/06/17 0339 01/07/17 0526  NA 138 141 139  K 3.8 4.1 4.7  CL 106 113* 108  CO2 24 23 23   BUN 17 20 20   CREATININE 1.31* 1.31* 1.31*  GLUCOSE 153* 189* 209*   Electrolytes Recent Labs  Lab 01/04/17 0442 01/05/17 0357 01/06/17 0339 01/07/17 0526  CALCIUM 8.0* 8.1* 8.1* 8.0*  MG 2.3 2.3 2.2  --   PHOS 2.6 2.1*  2.6  --    CBC Recent Labs  Lab 01/04/17 0442 01/06/17 0339 01/07/17 0526  WBC 11.5* 18.5* 17.2*  HGB 13.6 13.7 13.0  HCT 40.6 41.7 40.2  PLT 354 331 295   Coag's No results for input(s): APTT, INR in the last 168 hours.  Sepsis Markers Recent Labs  Lab 01/07/17 0526  PROCALCITON 0.37   ABG No results for input(s): PHART, PCO2ART, PO2ART in the last 168 hours.  Liver Enzymes Recent Labs  Lab 01/04/17 0442 01/06/17 0339  AST 30 27  ALT 37 40  ALKPHOS 60 56  BILITOT 0.7 0.6  ALBUMIN 2.0* 2.1*   Cardiac Enzymes No results for input(s): TROPONINI, PROBNP in the last 168 hours.  Glucose Recent Labs  Lab 01/06/17 1314 01/06/17 1553 01/06/17 2009 01/06/17 2334 01/07/17 0409 01/07/17 0718  GLUCAP 143* 211* 168* 179* 187* 195*   Imaging Ct Angio Chest Pe W Or Wo Contrast  Result Date: 01/06/2017 CLINICAL DATA:  Dyspnea, tachycardia EXAM: CT ANGIOGRAPHY CHEST WITH CONTRAST TECHNIQUE: Multidetector CT imaging of the chest was performed using the standard protocol during bolus administration of intravenous contrast. Multiplanar CT image reconstructions and MIPs were obtained to evaluate the vascular anatomy. CONTRAST:  100mL ISOVUE-370 IOPAMIDOL (ISOVUE-370) INJECTION 76% COMPARISON:  None. FINDINGS: Cardiovascular: Satisfactory opacification of the pulmonary arteries to the segmental level. Nonocclusive saddle pulmonary embolus. Bilateral upper  lobe lobar pulmonary emboli. Bilateral lower lobe segmental and subsegmental pulmonary emboli. Normal heart size. No pericardial effusion. Right heart strain. Mediastinum/Nodes: No enlarged mediastinal, hilar, or axillary lymph nodes. Thyroid gland, trachea, and esophagus demonstrate no significant findings. Lungs/Pleura: Right upper lobe, right middle lobe, right lower lobe and left lower lobe airspace disease. No pleural effusion or pneumothorax. Upper Abdomen: Multiple small bowel air-fluid levels partially visualized. Again noted  is a left renal cyst. Musculoskeletal: No acute osseous abnormality. Review of the MIP images confirms the above findings. IMPRESSION: 1. Examination is positive for acute pulmonary embolus. Positive for acute PE with CT evidence of right heart strain (RV/LV Ratio = 2) consistent with at least submassive (intermediate risk) PE. The presence of right heart strain has been associated with an increased risk of morbidity and mortality. Please activate Code PE by paging [phone]. 2. Right upper lobe, right middle lobe, right lower lobe and left lower lobe airspace disease. Findings concerning for multilobar pneumonia. 3. Multiple small bowel air-fluid levels partially visualized consistent with known small bowel obstruction. Critical Value/emergent results were called by telephone at the time of interpretation on 01/06/2017 at 5:17 pm to Nurse Lanna PocheLindsey Moss , who verbally acknowledged these results. Electronically Signed   By: Elige KoHetal  Patel   On: 01/06/2017 17:20   STUDIES:  CTA chest 12/3 > Examination is positive for acute pulmonary embolus. Positive for acute PE with CT  evidence of right heart strain (RV/LV Ratio = 2). Right upper lobe, right middle lobe, right lower lobe and left lower lobe airspace disease. Findings concerning for multilobar pneumonia. Multiple small bowel air-fluid levels partially visualized consistent with known small bowel obstruction.  CULTURES: Sputum [no date] >>>  ANTIBIOTICS: Zosyn 11/23 >  SIGNIFICANT EVENTS: 11/23 admit with appendicitis to OR Ileus 12/3 PE, transfer to ICU.  LINES/TUBES: PICC RUE >>>  I reviewed chest CT myself, saddle embolism noted with ?pulmonary infarct.  ASSESSMENT / PLAN:  Acute hypoxemic respiratory failure secondary to pulmonary embolism: RV/LV ratio 2.0 by CT. Had been on subq heparin, now on infusion. Transferring to ICU per CCS.   Plan: - Telemetry monitoring - Supplemental O2 wean FiO2 to keep SpO2 > 90% down to 5L [place] - Heparin  infusion per pharmacy - Echocardiogram pending - Doppler bilateral upper an lower extremities reordered, was dropped for some reason, if mobile clot then will consider retrievable IVC filter given clot burden. - No EKOS, patient is not unstable and had recent surgery  Appendicitis with perforation Ileus - Per CCS - Continue TPN - Drain in place  ATX vs PNA - Continue zosyn - Follow CXR - Check PCT 0.37 but given concern for lung infarction will continue for now  AKI  - Gentle hydration - Follow BMP - I&O  Discussed with bedside RN and PCCM-NP.  Hold in the ICU until above studies are done, if normal and stable by AM may transfer out of the ICU.  Will need to determine which anti-coagulant will be appropriate for patient but will discuss that after stabilization from an ICU standpoint.  Alyson ReedyWesam G. Yacoub, M.D. [facility]. Pager: [phone]. After hours pager: [phone].  01/07/2017 9:04 AM

## 2017-01-07 NOTE — Progress Notes (Signed)
Pt weaned to 5lpm nasal cannula. Will continue to monitor.

## 2017-01-07 NOTE — Progress Notes (Signed)
ANTICOAGULATION CONSULT NOTE - Follow Up Consult  Pharmacy Consult for heparin Indication: pulmonary embolus  Labs: Recent Labs    01/04/17 0442 01/05/17 0357 01/06/17 0339 01/07/17 0052  HGB 13.6  --  13.7  --   HCT 40.6  --  41.7  --   PLT 354  --  331  --   HEPARINUNFRC  --   --   --  <0.10*  CREATININE 1.31* 1.31* 1.31*  --     Assessment: 61yo male undetectable on heparin with initial dosing for PE; no gtt issues or signs of bleeding per RN.  Goal of Therapy:  Heparin level 0.3-0.7 units/ml   Plan:  Will rebolus with heparin 4000 units and increase gtt by 4 units/kg/hr to 2100 units/hr and check level in 6hr.  Vernard GamblesVeronda Kama Cammarano, PharmD, BCPS  01/07/2017,1:32 AM

## 2017-01-07 NOTE — Progress Notes (Addendum)
ANTICOAGULATION CONSULT NOTE - Follow Up Consult  Pharmacy Consult for Heparin Indication: pulmonary embolus  No Known Allergies  Patient Measurements: Height: 5\' 11"  (180.3 cm) Weight: 290 lb (131.5 kg) IBW/kg (Calculated) : 75.3 Heparin Dosing Weight: 105 kg  Vital Signs: Temp: 100.4 F (38 C) (12/04 0720) Temp Source: Axillary (12/04 0720) BP: 112/73 (12/04 1000) Pulse Rate: 103 (12/04 1000)  Labs: Recent Labs    01/05/17 0357 01/06/17 0339 01/07/17 0052 01/07/17 0526 01/07/17 0908  HGB  --  13.7  --  13.0  --   HCT  --  41.7  --  40.2  --   PLT  --  331  --  295  --   HEPARINUNFRC  --   --  <0.10*  --  <0.10*  CREATININE 1.31* 1.31*  --  1.31*  --     Estimated Creatinine Clearance: 81.9 mL/min (A) (by C-G formula based on SCr of 1.31 mg/dL (H)).  Assessment:  61 yr old male with new bilateral PE, non-occlusive saddle PE per CTA. Currently on IV heparin at 2100 units/hr and HL remains undetectable. RN reports no s/s of bleeding and that heparin is infusing well.     Goal of Therapy:  Heparin level 0.3-0.7 units/ml Monitor platelets by anticoagulation protocol: Yes   Plan:  -Heparin 4000 units IV x 1 -Increase Heparin drip to begin at 2600 units/hr -F/u 6 hr HL. -Daily heparin level and CBC.  Vinnie LevelBenjamin Zanetta Dehaan, PharmD., BCPS Clinical Pharmacist Pager [phone]  Addendum: On closer examination, RN concerned that heparin line may not be infusing well. New line inserted and heparin drip switched to new line. Will continue with earlier plan and check 6 hr level.   Vinnie LevelBenjamin Waddell Iten, PharmD., BCPS Clinical Pharmacist Pager [phone]

## 2017-01-08 ENCOUNTER — Inpatient Hospital Stay (HOSPITAL_COMMUNITY): Payer: Self-pay

## 2017-01-08 DIAGNOSIS — I8290 Acute embolism and thrombosis of unspecified vein: Secondary | ICD-10-CM

## 2017-01-08 DIAGNOSIS — R609 Edema, unspecified: Secondary | ICD-10-CM

## 2017-01-08 DIAGNOSIS — I2699 Other pulmonary embolism without acute cor pulmonale: Secondary | ICD-10-CM

## 2017-01-08 DIAGNOSIS — R0902 Hypoxemia: Secondary | ICD-10-CM

## 2017-01-08 LAB — HEPARIN LEVEL (UNFRACTIONATED)
HEPARIN UNFRACTIONATED: 0.45 [IU]/mL (ref 0.30–0.70)
Heparin Unfractionated: 1.36 IU/mL — ABNORMAL HIGH (ref 0.30–0.70)
Heparin Unfractionated: 0.41 IU/mL (ref 0.30–0.70)

## 2017-01-08 LAB — GLUCOSE, CAPILLARY
GLUCOSE-CAPILLARY: 167 mg/dL — AB (ref 65–99)
GLUCOSE-CAPILLARY: 172 mg/dL — AB (ref 65–99)
GLUCOSE-CAPILLARY: 177 mg/dL — AB (ref 65–99)
GLUCOSE-CAPILLARY: 194 mg/dL — AB (ref 65–99)
Glucose-Capillary: 190 mg/dL — ABNORMAL HIGH (ref 65–99)
Glucose-Capillary: 183 mg/dL — ABNORMAL HIGH (ref 65–99)
Glucose-Capillary: 188 mg/dL — ABNORMAL HIGH (ref 65–99)

## 2017-01-08 LAB — BASIC METABOLIC PANEL
Anion gap: 9 (ref 5–15)
BUN: 21 mg/dL — ABNORMAL HIGH (ref 6–20)
CALCIUM: 8 mg/dL — AB (ref 8.9–10.3)
CO2: 24 mmol/L (ref 22–32)
CREATININE: 1.27 mg/dL — AB (ref 0.61–1.24)
Chloride: 104 mmol/L (ref 101–111)
GFR calc non Af Amer: 59 mL/min — ABNORMAL LOW (ref >60)
Glucose, Bld: 197 mg/dL — ABNORMAL HIGH (ref 65–99)
Potassium: 4.3 mmol/L (ref 3.5–5.1)
SODIUM: 137 mmol/L (ref 135–145)

## 2017-01-08 LAB — CBC
HCT: 39.6 % (ref 39.0–52.0)
HEMOGLOBIN: 12.9 g/dL — AB (ref 13.0–17.0)
MCH: 30.9 pg (ref 26.0–34.0)
MCHC: 32.6 g/dL (ref 30.0–36.0)
MCV: 94.7 fL (ref 78.0–100.0)
Platelets: 285 10*3/uL (ref 150–400)
RBC: 4.18 MIL/uL — AB (ref 4.22–5.81)
RDW: 15.7 % — ABNORMAL HIGH (ref 11.5–15.5)
WBC: 18.2 10*3/uL — ABNORMAL HIGH (ref 4.0–10.5)

## 2017-01-08 LAB — PROCALCITONIN: PROCALCITONIN: 0.24 ng/mL

## 2017-01-08 LAB — MAGNESIUM: MAGNESIUM: 2.2 mg/dL (ref 1.7–2.4)

## 2017-01-08 LAB — PHOSPHORUS: Phosphorus: 2.8 mg/dL (ref 2.5–4.6)

## 2017-01-08 MED ORDER — TRAVASOL 10 % IV SOLN
INTRAVENOUS | Status: AC
  Administered 2017-01-08: 17:00:00 via INTRAVENOUS
  Filled 2017-01-08: qty 1296

## 2017-01-08 MED ORDER — HYDROMORPHONE HCL 1 MG/ML IJ SOLN
0.2500 mg | Freq: Once | INTRAMUSCULAR | Status: AC
  Administered 2017-01-08: 0.25 mg via INTRAVENOUS

## 2017-01-08 MED ORDER — HEPARIN (PORCINE) IN NACL 100-0.45 UNIT/ML-% IJ SOLN
2250.0000 [IU]/h | INTRAMUSCULAR | Status: DC
  Administered 2017-01-08 – 2017-01-09 (×2): 2100 [IU]/h via INTRAVENOUS
  Administered 2017-01-09: 2250 [IU]/h via INTRAVENOUS
  Filled 2017-01-08 (×5): qty 250

## 2017-01-08 MED ORDER — POLYETHYLENE GLYCOL 3350 17 G PO PACK
17.0000 g | PACK | Freq: Two times a day (BID) | ORAL | Status: DC
  Administered 2017-01-10: 17 g via ORAL
  Filled 2017-01-08 (×9): qty 1

## 2017-01-08 MED ORDER — PREMIER PROTEIN SHAKE
11.0000 [oz_av] | Freq: Two times a day (BID) | ORAL | Status: DC
  Administered 2017-01-08 – 2017-01-13 (×3): 11 [oz_av] via ORAL
  Filled 2017-01-08 (×19): qty 325.31

## 2017-01-08 NOTE — Progress Notes (Addendum)
11 Days Post-Op   Subjective/Chief Complaint: Feels better passing gas  Still with elevated RR Oxygenation better    Objective: Vital signs in last 24 hours: Temp:  [98.7 F (37.1 C)-100.6 F (38.1 C)] 100.6 F (38.1 C) (12/05 0437) Pulse Rate:  [100-113] 113 (12/05 0700) Resp:  [29-42] 42 (12/05 0700) BP: (104-152)/(67-117) 152/82 (12/05 0700) SpO2:  [94 %-97 %] 96 % (12/05 0700) Last BM Date: 01/05/17  Intake/Output from previous day: 12/04 0701 - 12/05 0700 In: 3538 [P.O.:220; I.V.:3143; IV Piggyback:175] Out: 1510 [Urine:1450; Drains:60] Intake/Output this shift: No intake/output data recorded.  Incision/Wound:soft less distended  Drain serous Not tender   Lab Results:  Recent Labs    01/07/17 0526 01/08/17 0505  WBC 17.2* 18.2*  HGB 13.0 12.9*  HCT 40.2 39.6  PLT 295 285   BMET Recent Labs    01/07/17 0526 01/08/17 0605  NA 139 137  K 4.7 4.3  CL 108 104  CO2 23 24  GLUCOSE 209* 197*  BUN 20 21*  CREATININE 1.31* 1.27*  CALCIUM 8.0* 8.0*   PT/INR No results for input(s): LABPROT, INR in the last 72 hours. ABG No results for input(s): PHART, HCO3 in the last 72 hours.  Invalid input(s): PCO2, PO2  Studies/Results: Dg Abd 1 View  Result Date: 01/06/2017 CLINICAL DATA:  Ileus, clinical improvement. EXAM: ABDOMEN - 1 VIEW COMPARISON:  Supine abdominal radiographs of January 04, 2017 FINDINGS: There remain loops of moderately distended gas-filled small bowel. The degree of distention appears stable. There is no significant large bowel gas observed. The abdominal and pelvic drainage catheter is in stable position. The NG tube tip is faintly visible in the upper aspect of the image but is not in appropriate position. IMPRESSION: Fairly stable appearance of the small bowel distention compatible with an ileus. The esophagogastric tube has been withdrawn and advancement by at least 15-20 cm is recommended. Electronically Signed   By: David  SwazilandJordan M.D.    On: 01/06/2017 08:11   Ct Angio Chest Pe W Or Wo Contrast  Result Date: 01/06/2017 CLINICAL DATA:  Dyspnea, tachycardia EXAM: CT ANGIOGRAPHY CHEST WITH CONTRAST TECHNIQUE: Multidetector CT imaging of the chest was performed using the standard protocol during bolus administration of intravenous contrast. Multiplanar CT image reconstructions and MIPs were obtained to evaluate the vascular anatomy. CONTRAST:  100mL ISOVUE-370 IOPAMIDOL (ISOVUE-370) INJECTION 76% COMPARISON:  None. FINDINGS: Cardiovascular: Satisfactory opacification of the pulmonary arteries to the segmental level. Nonocclusive saddle pulmonary embolus. Bilateral upper lobe lobar pulmonary emboli. Bilateral lower lobe segmental and subsegmental pulmonary emboli. Normal heart size. No pericardial effusion. Right heart strain. Mediastinum/Nodes: No enlarged mediastinal, hilar, or axillary lymph nodes. Thyroid gland, trachea, and esophagus demonstrate no significant findings. Lungs/Pleura: Right upper lobe, right middle lobe, right lower lobe and left lower lobe airspace disease. No pleural effusion or pneumothorax. Upper Abdomen: Multiple small bowel air-fluid levels partially visualized. Again noted is a left renal cyst. Musculoskeletal: No acute osseous abnormality. Review of the MIP images confirms the above findings. IMPRESSION: 1. Examination is positive for acute pulmonary embolus. Positive for acute PE with CT evidence of right heart strain (RV/LV Ratio = 2) consistent with at least submassive (intermediate risk) PE. The presence of right heart strain has been associated with an increased risk of morbidity and mortality. Please activate Code PE by paging [phone]. 2. Right upper lobe, right middle lobe, right lower lobe and left lower lobe airspace disease. Findings concerning for multilobar pneumonia. 3. Multiple small bowel air-fluid  levels partially visualized consistent with known small bowel obstruction. Critical Value/emergent  results were called by telephone at the time of interpretation on 01/06/2017 at 5:17 pm to Nurse Lanna PocheLindsey Moss , who verbally acknowledged these results. Electronically Signed   By: Elige KoHetal  Patel   On: 01/06/2017 17:20   Dg Chest Port 1 View  Result Date: 01/06/2017 CLINICAL DATA:  Leukocytosis. EXAM: PORTABLE CHEST 1 VIEW COMPARISON:  CT abdomen and pelvis including lung bases January 02, 2017 FINDINGS: Central catheter tip is in the superior vena cava. No pneumothorax. There is airspace consolidation in the right lower lung zone with small right pleural effusion. There is atelectatic change left base. Lungs elsewhere clear. Heart size and pulmonary vascularity are normal. No adenopathy. There is degenerative change in the thoracic spine. IMPRESSION: Increase in consolidation right base with small right pleural effusion. Left base atelectasis noted. Heart size within normal limits. Central catheter tip in superior vena cava. No pneumothorax. Electronically Signed   By: Bretta BangWilliam  Woodruff III M.D.   On: 01/06/2017 08:47    Anti-infectives: Anti-infectives (From admission, onward)   Start     Dose/Rate Route Frequency Ordered Stop   12/28/16 0600  piperacillin-tazobactam (ZOSYN) IVPB 3.375 g     3.375 g 12.5 mL/hr over 240 Minutes Intravenous Every 8 hours 12/28/16 0048     12/28/16 0015  piperacillin-tazobactam (ZOSYN) IVPB 3.375 g     3.375 g 100 mL/hr over 30 Minutes Intravenous  Once 12/28/16 0002 12/28/16 0053      Assessment/Plan: s/p Procedure(s): APPENDECTOMY LAPAROSCOPIC (N/A) Patient Active Problem List   Diagnosis Date Noted  . Acute saddle pulmonary embolism with acute cor pulmonale (HCC)   . Acute respiratory failure with hypoxia (HCC)   . HCAP (healthcare-associated pneumonia)   . Acute appendicitis 12/28/2016  Advance diet  await po anticoagulation choice per CCM Stepdown bed  Ambulate  ABX for 3 more days  WBC  Elevated   Could be from PE vs  Intraabdominal source but  abdomen NT  Follow for now    LOS: 11 days    Maisie Fushomas A Owin Vignola 01/08/2017

## 2017-01-08 NOTE — Progress Notes (Signed)
Nutrition Follow-up  DOCUMENTATION CODES:   Morbid obesity  INTERVENTION:    Premier Protein (vanilla) BID between meals, each supplement provides 160 kcal and 30 gm protein.  Pharmacy to wean TPN as PO intake increases.  NUTRITION DIAGNOSIS:   Inadequate oral intake related to acute illness as evidenced by (diet just advanced to full liquids).  Ongoing  GOAL:   Patient will meet greater than or equal to 90% of their needs  Met with TPN  MONITOR:   PO intake, Supplement acceptance, Diet advancement, I & O's, Labs  ASSESSMENT:   61 yo male admitted with 1-2 days of abdominal pain with acute perforated appendicitis with peritonitis, taken to OR for appendectomy 11/24. Pt with hx of DM  Currently receiving TPN at 90 ml/h to provide 2284 kcal and 130 gm protein per day. Diet was just advanced to full liquids this morning. Patient reports that he tolerated clear liquids yesterday and ate all of his full liquid breakfast today. He agreed to try Premier Protein supplement BID to maximize protein intake.  Labs and medications reviewed. CBG's: [phone]  Diet Order:  TPN ADULT (ION) Diet full liquid Room service appropriate? Yes; Fluid consistency: Thin TPN ADULT (ION)  EDUCATION NEEDS:   No education needs have been identified at this time  Skin:  Skin Assessment: Reviewed RN Assessment  Last BM:  12/5 (liquid)  Height:   Ht Readings from Last 1 Encounters:  12/27/16 5' 11"  (1.803 m)    Weight:   Wt Readings from Last 1 Encounters:  12/27/16 290 lb (131.5 kg)    Ideal Body Weight:  78 kg  BMI:  Body mass index is 40.45 kg/m.  Estimated Nutritional Needs:   Kcal:  2200-2500 kcals  Protein:  125-140 g  Fluid:  >/= 2 L   Molli Barrows, RD, LDN, [place] Pager [phone] After Hours Pager [phone]

## 2017-01-08 NOTE — Progress Notes (Signed)
ANTICOAGULATION CONSULT NOTE - Follow Up Consult  Pharmacy Consult for Heparin  Indication: pulmonary embolus  No Known Allergies  Patient Measurements: Height: 5\' 11"  (180.3 cm) Weight: 290 lb (131.5 kg) IBW/kg (Calculated) : 75.3 Heparin Dosing Weight: 105 kg  Vital Signs: Temp: 98.6 F (37 C) (12/05 2342) Temp Source: Oral (12/05 2342) BP: 121/91 (12/05 1800) Pulse Rate: 108 (12/05 1900)  Labs: Recent Labs    01/06/17 0339  01/07/17 0526  01/08/17 0505 01/08/17 0605 01/08/17 1543 01/08/17 2305  HGB 13.7  --  13.0  --  12.9*  --   --   --   HCT 41.7  --  40.2  --  39.6  --   --   --   PLT 331  --  295  --  285  --   --   --   HEPARINUNFRC  --    < >  --    < > 1.36*  --  0.41 0.45  CREATININE 1.31*  --  1.31*  --   --  1.27*  --   --    < > = values in this interval not displayed.    Estimated Creatinine Clearance: 84.5 mL/min (A) (by C-G formula based on SCr of 1.27 mg/dL (H)).  Assessment: 61 yr old male with new bilateral PE, non-occlusive saddle PE per CTA. Heparin level is therapeutic x 2 on 2100 units/hr. Concern for some infusion issues with the previous low levels.     Goal of Therapy:  Heparin level 0.3-0.7 units/ml Monitor platelets by anticoagulation protocol: Yes   Plan:  Cont heparin at 2100 units/hr Daily CBC/HL  Abran DukeJames Oona Trammel, PharmD, BCPS Clinical Pharmacist Phone: [phone]

## 2017-01-08 NOTE — Progress Notes (Signed)
PULMONARY / CRITICAL CARE MEDICINE   Name: Tyler OsierMichael Rougeau MRN: [id number] DOB: 07/04/1955    ADMISSION DATE:  12/27/2016 CONSULTATION DATE:  12/3  REFERRING MD:  CCS Cornett  CHIEF COMPLAINT:  PE  HISTORY OF PRESENT ILLNESS:   61 year old male with PMH as below, which is significant for DM. He presented 12/23 with abdominal pain and was diagnosed with appendicitis and taken to OR. Appendix found to have perforated. This was repaired and drain was placed. Course complicated by Ileus and ATX vs PNA. Then 12/3 he had a sudden onset SOB and desaturation with ambulation. CT angiogram was performed and demonstrated PE with increased RV/LV ratio (2). PCCM called for further evaluation.   SUBJECTIVE:  Feels better, down to 5L and well tolerated  VITAL SIGNS: BP 135/79   Pulse (!) 108   Temp 100 F (37.8 C) (Oral)   Resp (!) 35   Ht 5\' 11"  (1.803 m)   Wt 131.5 kg (290 lb)   SpO2 94%   BMI 40.45 kg/m   HEMODYNAMICS:    VENTILATOR SETTINGS:    INTAKE / OUTPUT: I/O last 3 completed shifts: In: 5028.7 [P.O.:220; I.V.:4516.2; Other:30; IV Piggyback:262.5] Out: 2210 [Urine:2150; Drains:60]  PHYSICAL EXAMINATION: General:  Morbidly obese male, resting in bed, NAD Neuro:  Alert and interactive, moving all ext to command HEENT:  [place]/AT, PERRL, EOM-I and MMM Cardiovascular:  RRR, Nl S1/S2, -M/R/G Lungs:  Bibasilar crackles Abdomen:  Obese, soft, non-tender, non-distended. LLQ drain with clear yellow drainage. Musculoskeletal:  No acute deformity or ROM limitation Skin:  Grossly intact  LABS:  BMET Recent Labs  Lab 01/06/17 0339 01/07/17 0526 01/08/17 0605  NA 141 139 137  K 4.1 4.7 4.3  CL 113* 108 104  CO2 23 23 24   BUN 20 20 21*  CREATININE 1.31* 1.31* 1.27*  GLUCOSE 189* 209* 197*   Electrolytes Recent Labs  Lab 01/05/17 0357 01/06/17 0339 01/07/17 0526 01/08/17 0605  CALCIUM 8.1* 8.1* 8.0* 8.0*  MG 2.3 2.2  --  2.2  PHOS 2.1* 2.6  --  2.8   CBC Recent  Labs  Lab 01/06/17 0339 01/07/17 0526 01/08/17 0505  WBC 18.5* 17.2* 18.2*  HGB 13.7 13.0 12.9*  HCT 41.7 40.2 39.6  PLT 331 295 285   Coag's No results for input(s): APTT, INR in the last 168 hours.  Sepsis Markers Recent Labs  Lab 01/07/17 0526 01/08/17 0605  PROCALCITON 0.37 0.24   ABG No results for input(s): PHART, PCO2ART, PO2ART in the last 168 hours.  Liver Enzymes Recent Labs  Lab 01/04/17 0442 01/06/17 0339  AST 30 27  ALT 37 40  ALKPHOS 60 56  BILITOT 0.7 0.6  ALBUMIN 2.0* 2.1*   Cardiac Enzymes No results for input(s): TROPONINI, PROBNP in the last 168 hours.  Glucose Recent Labs  Lab 01/07/17 1111 01/07/17 1509 01/07/17 1934 01/08/17 0107 01/08/17 0433 01/08/17 0725  GLUCAP 189* 180* 178* 190* 194* 183*   Imaging No results found. STUDIES:  CTA chest 12/3 > Examination is positive for acute pulmonary embolus. Positive for acute PE with CT  evidence of right heart strain (RV/LV Ratio = 2). Right upper lobe, right middle lobe, right lower lobe and left lower lobe airspace disease. Findings concerning for multilobar pneumonia. Multiple small bowel air-fluid levels partially visualized consistent with known small bowel obstruction.  CULTURES: Sputum [no date] >>>  ANTIBIOTICS: Zosyn 11/23 >  SIGNIFICANT EVENTS: 11/23 admit with appendicitis to OR Ileus 12/3 PE, transfer to  ICU.  LINES/TUBES: PICC RUE >>>  I reviewed chest CT myself, PE noted  ASSESSMENT / PLAN:  Acute hypoxemic respiratory failure secondary to pulmonary embolism: RV/LV ratio 2.0 by CT. Had been on subq heparin, now on infusion. Transferring to ICU per CCS.   Plan: - Telemetry monitoring - Supplemental O2 wean FiO2 to keep SpO2 > 90% down to 5L [place] - Heparin infusion per pharmacy - Echocardiogram with PHTN and enlarged RV - Doppler bilateral lower ext being done as we speak, if negative then patient can ambulate, if positive then will need an IVC filter that is  retrievable given pulmonary clot burden - No EKOS, patient is not unstable and had recent surgery  Appendicitis with perforation Ileus - Per CCS - Continue TPN per surgery - Drain in place  ATX vs PNA - Continue zosyn per surgery  - Follow CXR - Check PCT 0.37 but given concern for lung infarction will continue for now  AKI  - KVO IVF - TPN per surgery - Follow BMP - I&O  Discussed with PCCM-NP and bedside RN, transfer to SDU and PCCM will sign off, please call back if needed.  Alyson ReedyWesam G. Tyla Burgner, M.D. [facility]monary/Critical Care Medicine. Pager: [phone]. After hours pager: [phone].  01/08/2017 10:08 AM

## 2017-01-08 NOTE — Progress Notes (Signed)
PHARMACY - ADULT TOTAL PARENTERAL NUTRITION CONSULT NOTE   Pharmacy Consult:  TPN Indication:  Prolonged ileus  Patient Measurements: Height: 5\' 11"  (180.3 cm) Weight: 290 lb (131.5 kg) IBW/kg (Calculated) : 75.3 TPN AdjBW (KG): 89.4 Body mass index is 40.45 kg/m.  Assessment:  [age] YOM presented on 12/28/16 with abdominal pain that worsened after Thanksgiving.  Found to have acute appendicitis and was taken to the OR for appendectomy.  Patient tolerated diet advancement and then had projectile vomiting with green bile x2 on 01/02/17.  CT suggested obstruction.  Pharmacy consulted to manage TPN for prolonged ileus.  GI: On TPN for prolonged ileus. Albumin low at 2.1. Baseline prealbumin 7.2. NG tube pulled on 12/3. Drain O/P minimal with 60mL yesterday. On clear liquid diet and fair appetite with plan to advance to full liquid today. LBM 12/2, +flatus - PRN Zofran Endo: DM with A1c 7.2%, not on med PTA - CBGs borderline high (170-210s) Insulin requirements in the past 24 hours: 24 units of SSI + 40 units in TPN Lytes: wnl today. CoCa 9.4. Renal: SCr stable 1.27, BUN slightly up to 21- UOP 0.5 ml/kg/hr, NS 20K at 25 ml/hr, net +4.8L since admit Pulm: stable on RA Cards: BP controlled, tachy. CT showed PE so started on heparin Hepatobil: LFTs / tbili / TG WNL Neuro: PRN Dilaudid / chlorpromazine ID: Zosyn D#12 (11/24 >> ) for peritonitis. Tmax of 100.6 today, WBC up to 18.2.  TPN Access: PICC placed 01/03/17 TPN start date: 01/03/17   Nutritional Goals (per RD rec on 11/30): KCal: 2200-2500 Protein: 125-140 g Fluid: >/= 2 L  Current Nutrition:  Advancing to full liquid TPN  Plan:  Continue TPN at 2490ml/hr This TPN provides 130 g of protein, 367 g of dextrose, and 52 g of lipids for a total of 2,285 kcals meeting 100% of patient needs Electrolytes in TPN: continue current concentration, Cl:Ac 1:2 Add MVI, trace elements, and increase to 55 units of regular insulin in  TPN Continue resistant SSI and adjust as needed Monitor TPN labs F/U advancement of diet and ability to wean TPN   Enzo BiNathan Breydon Senters, PharmD, BCPS Clinical Pharmacist Pager [phone] 01/08/2017 7:25 AM

## 2017-01-08 NOTE — Progress Notes (Signed)
ANTICOAGULATION CONSULT NOTE - Follow Up Consult  Pharmacy Consult for Heparin  Indication: pulmonary embolus  No Known Allergies  Patient Measurements: Height: 5\' 11"  (180.3 cm) Weight: 290 lb (131.5 kg) IBW/kg (Calculated) : 75.3 Heparin Dosing Weight: 105 kg  Vital Signs: Temp: 100.6 F (38.1 C) (12/05 0437) Temp Source: Oral (12/05 0437) BP: 152/75 (12/05 0600) Pulse Rate: 103 (12/05 0600)  Labs: Recent Labs    01/06/17 0339  01/07/17 0526 01/07/17 0908 01/07/17 1825 01/08/17 0505  HGB 13.7  --  13.0  --   --  12.9*  HCT 41.7  --  40.2  --   --  39.6  PLT 331  --  295  --   --  285  HEPARINUNFRC  --    < >  --  <0.10* 0.86* 1.36*  CREATININE 1.31*  --  1.31*  --   --   --    < > = values in this interval not displayed.    Estimated Creatinine Clearance: 81.9 mL/min (A) (by C-G formula based on SCr of 1.31 mg/dL (H)).  Assessment: 61 yr old male with new bilateral PE, non-occlusive saddle PE per CTA. Heparin level is above goal on 2400 units/hr. Concern for some infusion issues with the previous low levels.     Goal of Therapy:  Heparin level 0.3-0.7 units/ml Monitor platelets by anticoagulation protocol: Yes   Plan:  -Hold heparin x 1 hr -Re-start heparin at 2100 units/hr at 0730 -1600 HL  Abran DukeJames Marvelyn Bouchillon, PharmD, BCPS Clinical Pharmacist Phone: [phone]

## 2017-01-08 NOTE — Progress Notes (Signed)
*  Preliminary Results* Bilateral upper extremity venous duplex completed. Right upper extremity is positive for deep and superficial vein thrombosis involving the right brachial vein and the right basilic vein surrounding the PICC line.  No evidence of deep or superficial vein thrombosis involving the left upper extremity.   Bilateral lower extremity venous duplex completed. The right lower extremity is positive for acute deep vein thrombosis involving the right distal femoral vein, right popliteal vein, and right posterior tibial veins. The left lower extremity is negative for deep vein thrombosis. There is no evidence of Baker's cyst bilaterally.  Preliminary results discussed with Dr. Molli KnockYacoub and Jamesetta SoPhyllis, RN.  01/08/2017 11:46 AM  Gertie FeyMichelle Baelynn Schmuhl, BS, RVT, RDCS, RDMS

## 2017-01-08 NOTE — Progress Notes (Signed)
ANTICOAGULATION CONSULT NOTE - Follow Up Consult  Pharmacy Consult for Heparin Indication: PE/DVT  No Known Allergies  Patient Measurements: Height: 5\' 11"  (180.3 cm) Weight: 290 lb (131.5 kg) IBW/kg (Calculated) : 75.3 Heparin Dosing Weight:  105 kg  Vital Signs: Temp: 98 F (36.7 C) (12/05 1542) Temp Source: Oral (12/05 1542) BP: 115/70 (12/05 1400) Pulse Rate: 108 (12/05 1600)  Labs: Recent Labs    01/06/17 0339  01/07/17 0526  01/07/17 1825 01/08/17 0505 01/08/17 0605 01/08/17 1543  HGB 13.7  --  13.0  --   --  12.9*  --   --   HCT 41.7  --  40.2  --   --  39.6  --   --   PLT 331  --  295  --   --  285  --   --   HEPARINUNFRC  --    < >  --    < > 0.86* 1.36*  --  0.41  CREATININE 1.31*  --  1.31*  --   --   --  1.27*  --    < > = values in this interval not displayed.    Estimated Creatinine Clearance: 84.5 mL/min (A) (by C-G formula based on SCr of 1.27 mg/dL (H)).  Assessment:  Anticoag: Heparin for bilateral PE and non-occlusive saddle PE per CTA. +DVT. H/H and Plt wnl. HL was 1.36 on 2400 units/hr. Rate adjusted. New HL 0.41 in goal.  Goal of Therapy:  Heparin level 0.3-0.7 units/ml Monitor platelets by anticoagulation protocol: Yes   Plan:  Continue IV heparin at 2100 units/hr Reconfirm therapeutic level in 6 hrs. Daily HL and CBC   Deneane Stifter S. Merilynn Finlandobertson, PharmD, BCPS Clinical Staff Pharmacist Pager [phone]  Misty Stanleyobertson, Jennefer Kopp Stillinger 01/08/2017,4:29 PM

## 2017-01-09 ENCOUNTER — Encounter (HOSPITAL_COMMUNITY): Payer: Self-pay | Admitting: Physician Assistant

## 2017-01-09 DIAGNOSIS — I2609 Other pulmonary embolism with acute cor pulmonale: Secondary | ICD-10-CM

## 2017-01-09 LAB — CBC
HEMATOCRIT: 36.6 % — AB (ref 39.0–52.0)
Hemoglobin: 12.2 g/dL — ABNORMAL LOW (ref 13.0–17.0)
MCH: 30.7 pg (ref 26.0–34.0)
MCHC: 33.3 g/dL (ref 30.0–36.0)
MCV: 92.2 fL (ref 78.0–100.0)
Platelets: 274 10*3/uL (ref 150–400)
RBC: 3.97 MIL/uL — ABNORMAL LOW (ref 4.22–5.81)
RDW: 15.2 % (ref 11.5–15.5)
WBC: 15.4 10*3/uL — AB (ref 4.0–10.5)

## 2017-01-09 LAB — GLUCOSE, CAPILLARY
GLUCOSE-CAPILLARY: 140 mg/dL — AB (ref 65–99)
GLUCOSE-CAPILLARY: 92 mg/dL (ref 65–99)
GLUCOSE-CAPILLARY: 101 mg/dL — AB (ref 65–99)
Glucose-Capillary: 188 mg/dL — ABNORMAL HIGH (ref 65–99)
Glucose-Capillary: 185 mg/dL — ABNORMAL HIGH (ref 65–99)
Glucose-Capillary: 180 mg/dL — ABNORMAL HIGH (ref 65–99)

## 2017-01-09 LAB — MAGNESIUM: Magnesium: 2.2 mg/dL (ref 1.7–2.4)

## 2017-01-09 LAB — COMPREHENSIVE METABOLIC PANEL
ALK PHOS: 56 U/L (ref 38–126)
ALT: 42 U/L (ref 17–63)
ANION GAP: 5 (ref 5–15)
AST: 24 U/L (ref 15–41)
Albumin: 1.9 g/dL — ABNORMAL LOW (ref 3.5–5.0)
BILIRUBIN TOTAL: 1.2 mg/dL (ref 0.3–1.2)
BUN: 20 mg/dL (ref 6–20)
CALCIUM: 7.8 mg/dL — AB (ref 8.9–10.3)
CO2: 23 mmol/L (ref 22–32)
Chloride: 105 mmol/L (ref 101–111)
Creatinine, Ser: 1.34 mg/dL — ABNORMAL HIGH (ref 0.61–1.24)
GFR calc non Af Amer: 56 mL/min — ABNORMAL LOW (ref >60)
Glucose, Bld: 188 mg/dL — ABNORMAL HIGH (ref 65–99)
POTASSIUM: 4.3 mmol/L (ref 3.5–5.1)
SODIUM: 133 mmol/L — AB (ref 135–145)
TOTAL PROTEIN: 6.1 g/dL — AB (ref 6.5–8.1)

## 2017-01-09 LAB — PROCALCITONIN: PROCALCITONIN: 0.2 ng/mL

## 2017-01-09 LAB — HEPARIN LEVEL (UNFRACTIONATED)
Heparin Unfractionated: 0.21 IU/mL — ABNORMAL LOW (ref 0.30–0.70)
Heparin Unfractionated: 0.35 IU/mL (ref 0.30–0.70)

## 2017-01-09 LAB — PHOSPHORUS: PHOSPHORUS: 2.8 mg/dL (ref 2.5–4.6)

## 2017-01-09 MED ORDER — HEPARIN (PORCINE) IN NACL 100-0.45 UNIT/ML-% IJ SOLN
2300.0000 [IU]/h | INTRAMUSCULAR | Status: DC
  Administered 2017-01-09: 2300 [IU]/h via INTRAVENOUS
  Filled 2017-01-09 (×2): qty 250

## 2017-01-09 MED ORDER — INSULIN ASPART 100 UNIT/ML ~~LOC~~ SOLN: 0.0000 [IU] | Freq: Every day | SUBCUTANEOUS | Status: DC

## 2017-01-09 MED ORDER — INSULIN ASPART 100 UNIT/ML ~~LOC~~ SOLN
0.0000 [IU] | Freq: Three times a day (TID) | SUBCUTANEOUS | Status: DC
  Administered 2017-01-09: 3 [IU] via SUBCUTANEOUS
  Administered 2017-01-09: 4 [IU] via SUBCUTANEOUS
  Administered 2017-01-10 – 2017-01-15 (×6): 3 [IU] via SUBCUTANEOUS

## 2017-01-09 NOTE — Consult Note (Signed)
Medical Consultation   Tyler OsierMichael Franklin  ZOX:[id number]  DOB: 04-Nov-1955  DOA: 12/27/2016  PCP: Patient, No Pcp Per    Requesting physician: Dr. Luisa Hartornett, CCS  Reason for consultation: To help in the management of anticoagulation    History of Present Illness: Tyler Franklin is an 61 y.o. male with a history of diabetes, and recent appendicitis diagnosis on 01/26/2017, status post appendicectomy, which was perforated, requiring repair and drain, complicated by ileus, and possible pneumonia on the right upper lobe, right middle lobe, and right lower lobe, as well as left lower lobe.  On 01/06/2017, the patient developed acute onset of shortness of breath, with desaturation with ambulation.  CT angio of the chest was performed, demonstrating PE, with right heart strain-increased RV-LV ratio of 2, consistent with least su b massive.  He was taken to ICU, and required critical care medicine involvement.  He received heparin, oxygen, and a 2D echo was performed, confirming right heart strain, normal systolic, EF 50-55%, grade 1 diastolic.  In addition, he had Doppler of both lower extremities, confirming DVT in the brachial vein, acute superficial venous thrombosis in the basilic vein surrounding the PICC line, and negative on the left.  The patient was considered high risk for thrombolytic therapy, given recent surgery on 12/27/2016.  He is not IVC filter candidate Denies any fever, chills, night sweats.  He denies any chest pain or palpitations at this time .  He denies any nausea or vomiting.  No abdominal pain other than the postsurgical incisional pain, he denies any dysuria, hematuria, or constipation.  He denies any lower extremity swelling, or calf pain.  The patient is a poor historian, but no apparent confusion is noted.  He denies any prior history of PE or DVT.  He does have a brother who had a stroke, and DVT.  No recent long distance trips.  The patient denies any hormonal  supplement.  He is pretty sedentary.  No history of tobacco, alcohol or recreational drug use.   Review of Systems:  As per HPI otherwise all other systems reviewed and are negative    Past Medical History: Past Medical History:  Diagnosis Date  . Diabetes mellitus without complication [facility](HCC)     Past Surgical History: Past Surgical History:  Procedure Laterality Date  . LAPAROSCOPIC APPENDECTOMY N/A 12/28/2016   Procedure: APPENDECTOMY LAPAROSCOPIC;  Surgeon: Berna Bueonnor, Chelsea A, MD;  Location: [facility] OR;  Service: General;  Laterality: N/A;     Allergies:  No Known Allergies   Social History: Social History   Socioeconomic History  . Marital status: Single    Spouse name: Not on file  . Number of children: Not on file  . Years of education: Not on file  . Highest education level: Not on file  Social Needs  . Financial resource strain: Not on file  . Food insecurity - worry: Not on file  . Food insecurity - inability: Not on file  . Transportation needs - medical: Not on file  . Transportation needs - non-medical: Not on file  Occupational History  . Not on file  Tobacco Use  . Smoking status: Never Smoker  . Smokeless tobacco: Never Used  Substance and Sexual Activity  . Alcohol use: No    Frequency: Never  . Drug use: No  . Sexual activity: Not on file  Other Topics Concern  . Not on file  Social History Narrative  .  Not on file       Family History: History reviewed. No pertinent family history.  Family history reviewed and not pertinent    Physical Exam: Vitals:   01/09/17 0700 01/09/17 0709 01/09/17 0800 01/09/17 0900  BP: 112/73  122/61 (!) 119/94  Pulse: 94  92 94  Resp: (!) 24  (!) 22 (!) 31  Temp:  98.5 F (36.9 C)    TempSrc:  Oral    SpO2: 93%  97% 95%  Weight:      Height:        Constitutional: Appears calm,  alert and awake, oriented x3, not in any acute distress. Eyes: PERLA, EOMI, irises appear normal, anicteric sclera,  ENMT:  external ears and nose appear normal, normal hearing oropharynx mucosa, tongue, appear normal several missing teeth noted Neck: neck appears normal, no masses, normal ROM, no thyromegaly, no JVD  CVS: S1-S2 clear, no murmur rubs or gallops, no LE edema, right upper extremity, with mild tense edema.  Normal pedal pulses  Respiratory: clear to auscultation bilaterally, no wheezing, rales, expiratory rhonchi was noted.Marland Kitchen Respiratory effort normal. No accessory muscle use.  Abdomen: soft nontender, mildly distended, left lower quadrant bandaged, at the area of drain, normal bowel sounds, no hepatosplenomegaly  Musculoskeletal: no cyanosis, clubbing or edema noted bilaterally. Joint/bones/muscle exam, strength, contractures or atrophy Neuro: Cranial nerves II-XII intact, strength, sensation, reflexes Psych: judgement and insight appear at his normal, stable mood , flat affect, mental status Skin: no rashes or lesions or ulcers, no induration or nodules   Data reviewed:  I have personally reviewed following labs and imaging studies Labs:  CBC: Recent Labs  Lab 01/04/17 0442 01/06/17 0339 01/07/17 0526 01/08/17 0505 01/09/17 0830  WBC 11.5* 18.5* 17.2* 18.2* 15.4*  NEUTROABS 8.3* 15.0*  --   --   --   HGB 13.6 13.7 13.0 12.9* 12.2*  HCT 40.6 41.7 40.2 39.6 36.6*  MCV 94.2 92.7 93.5 94.7 92.2  PLT 354 331 295 285 274    Basic Metabolic Panel: Recent Labs  Lab 01/04/17 0442 01/05/17 0357 01/06/17 0339 01/07/17 0526 01/08/17 0605 01/09/17 0320  NA 138 138 141 139 137 133*  K 3.7 3.8 4.1 4.7 4.3 4.3  CL 104 106 113* 108 104 105  CO2 28 24 23 23 24 23   GLUCOSE 139* 153* 189* 209* 197* 188*  BUN 13 17 20 20  21* 20  CREATININE 1.31* 1.31* 1.31* 1.31* 1.27* 1.34*  CALCIUM 8.0* 8.1* 8.1* 8.0* 8.0* 7.8*  MG 2.3 2.3 2.2  --  2.2 2.2  PHOS 2.6 2.1* 2.6  --  2.8 2.8   GFR Estimated Creatinine Clearance: 80.1 mL/min (A) (by C-G formula based on SCr of 1.34 mg/dL (H)). Liver Function  Tests: Recent Labs  Lab 01/04/17 0442 01/06/17 0339 01/09/17 0320  AST 30 27 24   ALT 37 40 42  ALKPHOS 60 56 56  BILITOT 0.7 0.6 1.2  PROT 6.0* 6.0* 6.1*  ALBUMIN 2.0* 2.1* 1.9*   No results for input(s): LIPASE, AMYLASE in the last 168 hours. No results for input(s): AMMONIA in the last 168 hours. Coagulation profile No results for input(s): INR, PROTIME in the last 168 hours.  Cardiac Enzymes: No results for input(s): CKTOTAL, CKMB, CKMBINDEX, TROPONINI in the last 168 hours. BNP: Invalid input(s): POCBNP CBG: Recent Labs  Lab 01/08/17 1531 01/08/17 1927 01/08/17 2340 01/09/17 0341 01/09/17 0708  GLUCAP 188* 172* 167* 188* 185*   D-Dimer No results for input(s): DDIMER in the  last 72 hours. Hgb A1c No results for input(s): HGBA1C in the last 72 hours. Lipid Profile No results for input(s): CHOL, HDL, LDLCALC, TRIG, CHOLHDL, LDLDIRECT in the last 72 hours. Thyroid function studies No results for input(s): TSH, T4TOTAL, T3FREE, THYROIDAB in the last 72 hours.  Invalid input(s): FREET3 Anemia work up No results for input(s): VITAMINB12, FOLATE, FERRITIN, TIBC, IRON, RETICCTPCT in the last 72 hours. Urinalysis    Component Value Date/Time   COLORURINE AMBER (A) 12/27/2016 2000   APPEARANCEUR HAZY (A) 12/27/2016 2000   LABSPEC 1.025 12/27/2016 2000   PHURINE 5.0 12/27/2016 2000   GLUCOSEU 150 (A) 12/27/2016 2000   HGBUR NEGATIVE 12/27/2016 2000   BILIRUBINUR NEGATIVE 12/27/2016 2000   KETONESUR NEGATIVE 12/27/2016 2000   PROTEINUR NEGATIVE 12/27/2016 2000   NITRITE NEGATIVE 12/27/2016 2000   LEUKOCYTESUR NEGATIVE 12/27/2016 2000     Sepsis Labs Invalid input(s): PROCALCITONIN,  WBC,  LACTICIDVEN Microbiology Recent Results (from the past 240 hour(s))  MRSA PCR Screening     Status: None   Collection Time: 01/06/17  9:51 PM  Result Value Ref Range Status   MRSA by PCR NEGATIVE NEGATIVE Final    Comment:        The GeneXpert MRSA Assay (FDA approved  for NASAL specimens only), is one component of a comprehensive MRSA colonization surveillance program. It is not intended to diagnose MRSA infection nor to guide or monitor treatment for MRSA infections.        Inpatient Medications:   Scheduled Meds: . insulin aspart  0-20 Units Subcutaneous TID WC  . insulin aspart  0-5 Units Subcutaneous QHS  . polyethylene glycol  17 g Oral BID  . protein supplement shake  11 oz Oral BID BM   Continuous Infusions: . 0.9 % NaCl with KCl 20 mEq / L 25 mL/hr at 01/08/17 1217  . chlorproMAZINE (THORAZINE) IV Stopped (01/06/17 0146)  . heparin 2,250 Units/hr (01/09/17 [id number])  . piperacillin-tazobactam (ZOSYN)  IV Stopped (01/09/17 0921)  . TPN ADULT (ION) 90 mL/hr at 01/08/17 1800     Radiological Exams on Admission: No results found.  Impression/Recommendations Active Problems:   Acute appendicitis   Acute saddle pulmonary embolism with acute cor pulmonale (HCC)   Acute respiratory failure with hypoxia (HCC)   HCAP (healthcare-associated pneumonia)    Acute submassive pulmonary embolus with right heart strain.  As evidenced by CT angiogram of the chest.  The patient was placed on heparin on 01/06/2017, was followed by CCM.  He received heparin, oxygen, and a 2D echo was performed, confirming right heart strain, normal systolic, EF 50-55%, grade 1 diastolic.  In addition, he had Doppler of both lower extremities, confirming DVT in the brachial vein, acute superficial venous thrombosis in the basilic vein surrounding the PICC line, and negative on the left..  The patient was considered high risk for thrombolytic therapy, given surgery on 12/27/2016.  Initially, consider for IVC filter placement given large clot burden, he was deemed not a candidate, given recommendations by the ACP.   Risk factors include obesity, untreated hypertension, diabetes, family history of clotting disorder in brother, as well as recent surgery.  The patient is on  heparin, recommend to bridge to Xarelto per pharmacy, versus other oral anticoagulant, tolerated by renal function. Recommend care management to help the patient to establish PCP care after discharge, and to help him with the medications  Other medical issues as per admitting team.  Thank you for this consultation.  Feel free to  contact us if you have any other questions or concerns.   Time Spent:   Marlowe Kays PA-C [facility] 01/09/2017, 10:01 AM

## 2017-01-09 NOTE — Progress Notes (Signed)
Telephone report called and given to EdroyRegina, Charity fundraiserN [phone]). All questions addressed. Pt and family updated. Belongings packed. Ready for transfer to [phone]. Pt wishes to wait for sister to return. Will continue to monitor.

## 2017-01-09 NOTE — Progress Notes (Signed)
12 Days Post-Op   Subjective/Chief Complaint: Pt feels better  Tolerating diet Has DVT right basilic vein at PICC site  DVT involving right popliteal femoral posterior tibial veins   Less pain sats are better     Objective: Vital signs in last 24 hours: Temp:  [97.8 F (36.6 C)-98.7 F (37.1 C)] 98.5 F (36.9 C) (12/06 0709) Pulse Rate:  [92-112] 92 (12/06 0800) Resp:  [20-44] 22 (12/06 0800) BP: (103-139)/(61-91) 122/61 (12/06 0800) SpO2:  [90 %-99 %] 97 % (12/06 0800) Last BM Date: 01/08/17  Intake/Output from previous day: 12/05 0701 - 12/06 0700 In: 3530 [P.O.:620; I.V.:2710; IV Piggyback:200] Out: 1405 [Urine:1350; Drains:35; Blood:20] Intake/Output this shift: Total I/O In: 546.2 [I.V.:546.2] Out: -   Resp: rhonchi bilaterally Cardio: regular rate and rhythm, S1, S2 normal, no murmur, click, rub or gallop Incision/Wound:soft  Less tender JP serous   Lab Results:  Recent Labs    01/07/17 0526 01/08/17 0505  WBC 17.2* 18.2*  HGB 13.0 12.9*  HCT 40.2 39.6  PLT 295 285   BMET Recent Labs    01/08/17 0605 01/09/17 0320  NA 137 133*  K 4.3 4.3  CL 104 105  CO2 24 23  GLUCOSE 197* 188*  BUN 21* 20  CREATININE 1.27* 1.34*  CALCIUM 8.0* 7.8*   PT/INR No results for input(s): LABPROT, INR in the last 72 hours. ABG No results for input(s): PHART, HCO3 in the last 72 hours.  Invalid input(s): PCO2, PO2  Studies/Results: No results found.  Anti-infectives: Anti-infectives (From admission, onward)   Start     Dose/Rate Route Frequency Ordered Stop   12/28/16 0600  piperacillin-tazobactam (ZOSYN) IVPB 3.375 g     3.375 g 12.5 mL/hr over 240 Minutes Intravenous Every 8 hours 12/28/16 0048     12/28/16 0015  piperacillin-tazobactam (ZOSYN) IVPB 3.375 g     3.375 g 100 mL/hr over 30 Minutes Intravenous  Once 12/28/16 0002 12/28/16 0053      Assessment/Plan: s/p Procedure(s): APPENDECTOMY LAPAROSCOPIC (N/A) Patient Active Problem List   Diagnosis Date Noted  . Acute saddle pulmonary embolism with acute cor pulmonale (HCC)   . Acute respiratory failure with hypoxia (HCC)   . HCAP (healthcare-associated pneumonia)   . Acute appendicitis 12/28/2016    ICU signed off  Keep in SDU Consult IR for filter given thrombus load  Continue heparin gtt Ask for medical consult for anticoagulation management Advance diet   Wean TNA to off Remove PICC line once off TNA  No ambulation until filter in place  No SCD   LOS: 12 days    Maisie Fushomas A Omarrion Carmer 01/09/2017

## 2017-01-09 NOTE — Care Management Note (Signed)
Case Management Note  Patient Details  Name: Eduardo OsierMichael Kimbrell MRN: [id number] Date of Birth: [birth year]  Subjective/Objective:    Pt admitted with acute onselt of SOB with desaturation when ambulating                Action/Plan:  PTA from home.  CM [facility] for 12/13 at 9:30.  Pt will likely need MATCH letter   Expected Discharge Date:                  Expected Discharge Plan:  Home/Self Care  In-House Referral:     Discharge planning Services  [facility]  Post Acute Care Choice:    Choice offered to:     DME Arranged:    DME Agency:     HH Arranged:    HH Agency:     Status of Service:     If discussed at [employer] Meetings, dates discussed:    Additional Comments:  Cherylann ParrClaxton, Tanesia Butner S, RN 01/09/2017, 2:29 PM

## 2017-01-09 NOTE — Progress Notes (Signed)
Patient has been admitted secondary to appendicitis.  He was found to have perforated appendicitis and had a drain placed during surgery.  He had a post op ileus and then developed SOB on 12/3.  He was found to have a PE on scan.  Further evaluation has revealed RLE DVT involving the right distal femoral vein, popliteal vein, and right posterior tibial veins.  A request has been made for an IVC filter to be placed secondary to large clot burden.  The patient is currently on a heparin gtt and is going to be evaluated by medicine to determine his anticoagulation regiment.  Dr. Loreta AveWagner has reviewed the case as well as his imaging.  His recommendation is not to proceed with a filter placement based off of the recommendations by the [employer], which is no filter placement if the patient has a standard of care anticoagulation that he is able to tolerate. Dr. Loreta AveWagner has contacted [facility] to relay this message as well.  Letha CapeKelly E Petina Muraski 9:46 AM 01/09/2017

## 2017-01-09 NOTE — Progress Notes (Signed)
ANTICOAGULATION CONSULT NOTE - Follow Up Consult  Pharmacy Consult for Heparin  Indication: pulmonary embolus  No Known Allergies  Patient Measurements: Height: 5\' 11"  (180.3 cm) Weight: 290 lb (131.5 kg) IBW/kg (Calculated) : 75.3 Heparin Dosing Weight: 105 kg  Vital Signs: Temp: 98 F (36.7 C) (12/06 0342) Temp Source: Oral (12/06 0342) BP: 124/69 (12/06 0500) Pulse Rate: 99 (12/06 0500)  Labs: Recent Labs    01/07/17 0526  01/08/17 0505 01/08/17 0605 01/08/17 1543 01/08/17 2305 01/09/17 0320 01/09/17 0535  HGB 13.0  --  12.9*  --   --   --   --   --   HCT 40.2  --  39.6  --   --   --   --   --   PLT 295  --  285  --   --   --   --   --   HEPARINUNFRC  --    < > 1.36*  --  0.41 0.45  --  0.21*  CREATININE 1.31*  --   --  1.27*  --   --  1.34*  --    < > = values in this interval not displayed.    Estimated Creatinine Clearance: 80.1 mL/min (A) (by C-G formula based on SCr of 1.34 mg/dL (H)).  Assessment: 61 yr old male with new bilateral PE, non-occlusive saddle PE per CTA. Heparin level is sub-therapeutic this AM on 2100 units/hr. No infusion issues overnight per RN.     Goal of Therapy:  Heparin level 0.3-0.7 units/ml Monitor platelets by anticoagulation protocol: Yes   Plan:  Inc heparin to 2250 units/hr 1400 HL  Abran DukeJames Dhani Imel, PharmD, BCPS Clinical Pharmacist Phone: [phone]

## 2017-01-09 NOTE — Progress Notes (Signed)
PHARMACY - ADULT TOTAL PARENTERAL NUTRITION CONSULT NOTE   Pharmacy Consult:  TPN Indication:  Prolonged ileus  Patient Measurements: Height: 5\' 11"  (180.3 cm) Weight: 290 lb (131.5 kg) IBW/kg (Calculated) : 75.3 TPN AdjBW (KG): 89.4 Body mass index is 40.45 kg/m.  Assessment:  Tyler Franklin presented on 12/28/16 with abdominal pain that worsened after Thanksgiving.  Found to have acute appendicitis and was taken to the OR for appendectomy.  Patient tolerated diet advancement and then had projectile vomiting with green bile x2 on 01/02/17.  CT suggested obstruction.  Pharmacy consulted to manage TPN for prolonged ileus.  GI: On TPN for prolonged ileus. Albumin low at 1.9. Baseline prealbumin 7.2. NG tube pulled on 12/3. Drain O/P minimal with 35mL yesterday. On full liquid diet with good appetite and tolerating well. LBM 12/5, +flatus - PRN Zofran Endo: DM with A1c 7.2%, not on med PTA - CBGs borderline high (170-190s) Insulin requirements in the past 24 hours: 24 units of SSI + 55 units in TPN Lytes: wnl today exc Na 133. CoCa 9.4. Renal: SCr stable 1.34, BUN slightly up to 20- UOP 0.4 ml/kg/hr, NS 20K at 25 ml/hr, net +6 L since admit Pulm: stable on RA Cards: BP controlled, tachy. CT showed PE so started on heparin Hepatobil: LFTs / tbili / TG WNL Neuro: PRN Dilaudid / chlorpromazine ID: Zosyn D#13 (11/24 >> ) for peritonitis. Tmax of 100.6 today, WBC up to 18.2.  TPN Access: PICC placed 01/03/17 TPN start date: 01/03/17   Nutritional Goals (per RD rec on 11/30): KCal: 2200-2500 Protein: 125-140 g Fluid: >/= 2 L  Current Nutrition:  Full liquid Premier Protein BID (30g of protein and 160 kCal) TPN  Plan:  Wean off TPN today by reducing rate to 3657m/hr for 2 hours from 1600 to 1800  D/C all TPN orders and labs Change resistant SSI to meal and bedtime coverage  Enzo BiNathan Nemiah Bubar, PharmD, BCPS Clinical Pharmacist Pager [phone] 01/09/2017 7:24 AM

## 2017-01-09 NOTE — Progress Notes (Addendum)
ANTICOAGULATION CONSULT NOTE - Follow Up Consult  Pharmacy Consult:  Heparin >> Xarelto Indication:  New PE / DVT  No Known Allergies  Patient Measurements: Height: 5\' 11"  (180.3 cm) Weight: 290 lb (131.5 kg) IBW/kg (Calculated) : 75.3  Vital Signs: Temp: 98.5 F (36.9 C) (12/06 0709) Temp Source: Oral (12/06 0709) BP: 119/94 (12/06 0900) Pulse Rate: 94 (12/06 0900)  Labs: Recent Labs    01/07/17 0526  01/08/17 0505 01/08/17 0605 01/08/17 1543 01/08/17 2305 01/09/17 0320 01/09/17 0535 01/09/17 0830  HGB 13.0  --  12.9*  --   --   --   --   --  12.2*  HCT 40.2  --  39.6  --   --   --   --   --  36.6*  PLT 295  --  285  --   --   --   --   --  274  HEPARINUNFRC  --    < > 1.36*  --  0.41 0.45  --  0.21*  --   CREATININE 1.31*  --   --  1.27*  --   --  1.34*  --   --    < > = values in this interval not displayed.    Estimated Creatinine Clearance: 80.1 mL/min (A) (by C-G formula based on SCr of 1.34 mg/dL (H)).     Assessment: 89 YOM presented with new PE and DVT currently on IV heparin.  Pharmacy received consult to transition patient to Xarelto when ready.  Spoke to PA, patient is currently on 4L [place] and is not yet ready to transfer to the floor; therefore, will hold off on the transition.  Patient's renal function is stable.   Goal of Therapy:  Appropriate anticoagulation Monitor platelets by anticoagulation protocol: Yes    Plan:  Hold off on transitioning to Xarelto.  Start when patient is stable enough to be transferred to the floor per PA. Continue heparin gtt at 2250 units/hr 1400 heparin level   Yusuke Beza D. Laney Potashang, PharmD, BCPS Pager:  [phone] 01/09/2017, 10:33 AM    ================================   Addendum: HL 0.35, low normal No bleeding Increase heparin gtt slightly to 2300 units/hr F/U AM labs   Darline Faith D. Laney Potashang, PharmD, BCPS Pager:  [phone] 01/09/2017, 2:31 PM

## 2017-01-10 ENCOUNTER — Inpatient Hospital Stay (HOSPITAL_COMMUNITY): Payer: Self-pay

## 2017-01-10 DIAGNOSIS — Z794 Long term (current) use of insulin: Secondary | ICD-10-CM

## 2017-01-10 DIAGNOSIS — E6609 Other obesity due to excess calories: Secondary | ICD-10-CM

## 2017-01-10 DIAGNOSIS — E1165 Type 2 diabetes mellitus with hyperglycemia: Secondary | ICD-10-CM

## 2017-01-10 LAB — GLUCOSE, CAPILLARY
GLUCOSE-CAPILLARY: 146 mg/dL — AB (ref 65–99)
GLUCOSE-CAPILLARY: 134 mg/dL — AB (ref 65–99)
GLUCOSE-CAPILLARY: 122 mg/dL — AB (ref 65–99)

## 2017-01-10 LAB — HEPARIN LEVEL (UNFRACTIONATED): Heparin Unfractionated: 0.12 IU/mL — ABNORMAL LOW (ref 0.30–0.70)

## 2017-01-10 LAB — HEMOGLOBIN A1C
Hgb A1c MFr Bld: 7.4 % — ABNORMAL HIGH (ref 4.8–5.6)
MEAN PLASMA GLUCOSE: 165.68 mg/dL

## 2017-01-10 MED ORDER — RIVAROXABAN 15 MG PO TABS
15.0000 mg | ORAL_TABLET | Freq: Two times a day (BID) | ORAL | Status: DC
  Administered 2017-01-10 – 2017-01-15 (×12): 15 mg via ORAL
  Filled 2017-01-10 (×12): qty 1

## 2017-01-10 MED ORDER — DOCUSATE SODIUM 100 MG PO CAPS
100.0000 mg | ORAL_CAPSULE | Freq: Every day | ORAL | Status: DC
  Administered 2017-01-10 – 2017-01-12 (×2): 100 mg via ORAL
  Filled 2017-01-10 (×3): qty 1

## 2017-01-10 NOTE — Progress Notes (Addendum)
PROGRESS NOTE    Eduardo OsierMichael Searls  BJY:[id number] DOB: 05-19-1955 DOA: 12/27/2016 PCP: Patient, No Pcp Per    Brief Narrative:  61 year old male who was admitted to the surgical service due to acute appendicitis. He was found to have a submassive pulmonary embolism. He was appropriately treated with intravenous heparin with no major complications.    Assessment & Plan:   Active Problems:   Acute appendicitis   Acute saddle pulmonary embolism with acute cor pulmonale (HCC)   Acute respiratory failure with hypoxia (HCC)   HCAP (healthcare-associated pneumonia)  1.  Acute submassive pulmonary embolism. Patient with no chest pain or significant dyspnea, positive dry cough but no hemoptysis. Echocardiogram with preserved LV systolic function, RV with moderate dilatation, pa peak pressure 70 mmHg. Will continue anticoagulation with rivaroxaban. Blood pressure stable, with systolic 108 to 121 mmHg, out of bed as tolerated.   2. Type 2 DM. Will continue glucose cover and monitoring with insulin sliding scale, capillary glucose 92, 101, 146, 134, 122.  Holding for now on basal insulin regimen.  3. Obesity. Will continue physical therapy as tolerated, incentive spirometer. Will need outpatient follow up.   DVT prophylaxis: rivaroxaban  Code Status: full Family Communication: no family at the bedside Disposition Plan: home   Consultants:     Procedures:     Antimicrobials:       Subjective: Patient feeling well, positive weakness and deconditioning, positive dyspnea on exertion, no chest pain or hemoptysis.   Objective: Vitals:   01/10/17 0820 01/10/17 1100 01/10/17 1130 01/10/17 1500  BP:  121/86  121/68  Pulse:      Resp:  (!) 39  (!) 38  Temp: 98.2 F (36.8 C)  98.7 F (37.1 C) 98.4 F (36.9 C)  TempSrc:      SpO2:      Weight:      Height:        Intake/Output Summary (Last 24 hours) at 01/10/2017 1717 Last data filed at 01/10/2017 1600 Gross per 24 hour  Intake  1056 ml  Output 550 ml  Net 506 ml   Filed Weights   12/27/16 1951  Weight: 131.5 kg (290 lb)    Examination:   General: Not in pain or dyspnea, deconditioned Neurology: Awake and alert, non focal  E ENT: no pallor, no icterus, oral mucosa moist Cardiovascular: No JVD. S1-S2 present, rhythmic, no gallops, rubs, or murmurs. ++ non pitting lower extremity edema. Pulmonary: mil decreased breath sounds bilaterally at bases, but adequate air movement, no wheezing, rhonchi or rales. Gastrointestinal. Abdomen protuberant, no organomegaly, non tender, no rebound or guarding Skin. No rashes Musculoskeletal: no joint deformities     Data Reviewed: I have personally reviewed following labs and imaging studies  CBC: Recent Labs  Lab 01/04/17 0442 01/06/17 0339 01/07/17 0526 01/08/17 0505 01/09/17 0830  WBC 11.5* 18.5* 17.2* 18.2* 15.4*  NEUTROABS 8.3* 15.0*  --   --   --   HGB 13.6 13.7 13.0 12.9* 12.2*  HCT 40.6 41.7 40.2 39.6 36.6*  MCV 94.2 92.7 93.5 94.7 92.2  PLT 354 331 295 285 274   Basic Metabolic Panel: Recent Labs  Lab 01/04/17 0442 01/05/17 0357 01/06/17 0339 01/07/17 0526 01/08/17 0605 01/09/17 0320  NA 138 138 141 139 137 133*  K 3.7 3.8 4.1 4.7 4.3 4.3  CL 104 106 113* 108 104 105  CO2 28 24 23 23 24 23   GLUCOSE 139* 153* 189* 209* 197* 188*  BUN 13 17 20  20  21* 20  CREATININE 1.31* 1.31* 1.31* 1.31* 1.27* 1.34*  CALCIUM 8.0* 8.1* 8.1* 8.0* 8.0* 7.8*  MG 2.3 2.3 2.2  --  2.2 2.2  PHOS 2.6 2.1* 2.6  --  2.8 2.8   GFR: Estimated Creatinine Clearance: 80.1 mL/min (A) (by C-G formula based on SCr of 1.34 mg/dL (H)). Liver Function Tests: Recent Labs  Lab 01/04/17 0442 01/06/17 0339 01/09/17 0320  AST 30 27 24   ALT 37 40 42  ALKPHOS 60 56 56  BILITOT 0.7 0.6 1.2  PROT 6.0* 6.0* 6.1*  ALBUMIN 2.0* 2.1* 1.9*   No results for input(s): LIPASE, AMYLASE in the last 168 hours. No results for input(s): AMMONIA in the last 168 hours. Coagulation  Profile: No results for input(s): INR, PROTIME in the last 168 hours. Cardiac Enzymes: No results for input(s): CKTOTAL, CKMB, CKMBINDEX, TROPONINI in the last 168 hours. BNP (last 3 results) No results for input(s): PROBNP in the last 8760 hours. HbA1C: Recent Labs    01/10/17 1103  HGBA1C 7.4*   CBG: Recent Labs  Lab 01/09/17 2009 01/09/17 2247 01/10/17 0822 01/10/17 1134 01/10/17 1649  GLUCAP 92 101* 146* 134* 122*   Lipid Profile: No results for input(s): CHOL, HDL, LDLCALC, TRIG, CHOLHDL, LDLDIRECT in the last 72 hours. Thyroid Function Tests: No results for input(s): TSH, T4TOTAL, FREET4, T3FREE, THYROIDAB in the last 72 hours. Anemia Panel: No results for input(s): VITAMINB12, FOLATE, FERRITIN, TIBC, IRON, RETICCTPCT in the last 72 hours.    Radiology Studies: I have reviewed all of the imaging during this hospital visit personally     Scheduled Meds: . docusate sodium  100 mg Oral Daily  . insulin aspart  0-20 Units Subcutaneous TID WC  . insulin aspart  0-5 Units Subcutaneous QHS  . polyethylene glycol  17 g Oral BID  . protein supplement shake  11 oz Oral BID BM  . Rivaroxaban  15 mg Oral BID WC   Continuous Infusions: . 0.9 % NaCl with KCl 20 mEq / L 25 mL/hr at 01/10/17 0032  . chlorproMAZINE (THORAZINE) IV Stopped (01/06/17 0146)     LOS: 13 days        Bronislaw Switzer Annett Gulaaniel Maritssa Haughton, MD [facility] Pager [phone]

## 2017-01-10 NOTE — Progress Notes (Signed)
ANTICOAGULATION CONSULT NOTE - Follow Up Consult  Pharmacy Consult for Heparin -> Xarelto Indication: new PE/ DVT  No Known Allergies  Patient Measurements: Height: 5\' 11"  (180.3 cm) Weight: 290 lb (131.5 kg) IBW/kg (Calculated) : 75.3  Vital Signs: Temp: 98.7 F (37.1 C) (12/07 1130) BP: 108/74 (12/07 0615)  Labs: Recent Labs    01/08/17 0505 01/08/17 0605  01/09/17 0320 01/09/17 0535 01/09/17 0830 01/09/17 1359 01/10/17 0405  HGB 12.9*  --   --   --   --  12.2*  --   --   HCT 39.6  --   --   --   --  36.6*  --   --   PLT 285  --   --   --   --  274  --   --   HEPARINUNFRC 1.36*  --    < >  --  0.21*  --  0.35 0.12*  CREATININE  --  1.27*  --  1.34*  --   --   --   --    < > = values in this interval not displayed.    Estimated Creatinine Clearance: 80.1 mL/min (A) (by C-G formula based on SCr of 1.34 mg/dL (H)).  Assessment:   61 yr old with new PE and DVT on 01/07/17.   Transferred out of ICU last night and transitioned from IV heparin to Xarelto this morning.  Goal of Therapy:  Heparin level 0.3-0.7 units/ml appropriate Xarelto dose for indication Monitor platelets by anticoagulation protocol: Yes   Plan:   Xarelto 15 mg BID x 21 days,  Then Xarelto 20 mg daily with supper to begin on 01/31/17.    Dennie Fettersgan, Awesome Jared Donovan, [place] Pager: [phone] 01/10/2017,4:21 PM

## 2017-01-10 NOTE — Progress Notes (Signed)
[facility] Progress Note  13 Days Post-Op  Subjective: CC:  C/o mild soreness over right hemiabdomen which he attributes to being in bed. Tolerating soft diet. Having flatus. Last BM 36 h ago. Denies fever, chills, nausea, vomiting, SOB.  Objective: Vital signs in last 24 hours: Temp:  [98 F (36.7 C)-99.1 F (37.3 C)] 98.2 F (36.8 C) (12/07 0820) Pulse Rate:  [83-98] 98 (12/06 2000) Resp:  [23-99] 31 (12/07 0700) BP: (100-122)/(62-76) 108/74 (12/07 0615) SpO2:  [93 %-96 %] 94 % (12/06 2000) Last BM Date: 01/08/17  Intake/Output from previous day: 12/06 0701 - 12/07 0700 In: 2520.3 [P.O.:480; I.V.:1875.3; IV Piggyback:150] Out: 1560 [Urine:1550; Drains:10] Intake/Output this shift: No intake/output data recorded.  PE: Gen:  Alert, NAD, pleasant Card:  Regular rate and rhythm (98 bpm during my exam), pedal pulses 2+ BL Pulm:  Normal effort, clear to auscultation bilaterally; 1000 cc on IS. Abd: Soft, non-tender, non-distended, bowel sounds present in all 4 quadrants  LLQ JP - 10 cc, serous  Skin: warm and dry, no rashes  Psych: A&Ox3   Lab Results:  Recent Labs    01/08/17 0505 01/09/17 0830  WBC 18.2* 15.4*  HGB 12.9* 12.2*  HCT 39.6 36.6*  PLT 285 274   BMET Recent Labs    01/08/17 0605 01/09/17 0320  NA 137 133*  K 4.3 4.3  CL 104 105  CO2 24 23  GLUCOSE 197* 188*  BUN 21* 20  CREATININE 1.27* 1.34*  CALCIUM 8.0* 7.8*   CMP     Component Value Date/Time   NA 133 (L) 01/09/2017 0320   K 4.3 01/09/2017 0320   CL 105 01/09/2017 0320   CO2 23 01/09/2017 0320   GLUCOSE 188 (H) 01/09/2017 0320   BUN 20 01/09/2017 0320   CREATININE 1.34 (H) 01/09/2017 0320   CALCIUM 7.8 (L) 01/09/2017 0320   PROT 6.1 (L) 01/09/2017 0320   ALBUMIN 1.9 (L) 01/09/2017 0320   AST 24 01/09/2017 0320   ALT 42 01/09/2017 0320   ALKPHOS 56 01/09/2017 0320   BILITOT 1.2 01/09/2017 0320   GFRNONAA 56 (L) 01/09/2017 0320   GFRAA >60 01/09/2017 0320    Lipase     Component Value Date/Time   LIPASE 15 12/27/2016 2015   Anti-infectives: Anti-infectives (From admission, onward)   Start     Dose/Rate Route Frequency Ordered Stop   12/28/16 0600  piperacillin-tazobactam (ZOSYN) IVPB 3.375 g     3.375 g 12.5 mL/hr over 240 Minutes Intravenous Every 8 hours 12/28/16 0048     12/28/16 0015  piperacillin-tazobactam (ZOSYN) IVPB 3.375 g     3.375 g 100 mL/hr over 30 Minutes Intravenous  Once 12/28/16 0002 12/28/16 0053     Assessment/Plan Acute perforated appendicitis with peritonitis POD #13- S/Plaparoscopic appendectomy and placement of drain11/24/18by Dr. Fredricka Bonineonnor - afebrile, VSS, WBCtrending down  - CT 11/29 fluid-filled dilated proximal small bowel with transition zone in LLQ.No abscess noted.  - NGT replaced 11/29 for ileus/emesis; ileus improved  -D/C abx today; OOB today  - JP in LLQ, plan to d.c drain prior to discharge   Acute submassive pulmonary embolus - 01/06/17, evidence of R heart strain, echo with EF50-55%  DVT RUE/RLE - medicine consulted. IR consulted for possible IVC filter and said it was not necessary. Both services recommended continuation chemical anticoagulation.  HTN - PRN meds here, will need OP F/U with PCP for HTN, DM, and anticoagulation  DM2 - poorly controlled, A1c pending  FEN -  SOFT diet, TNA d/c-ed after 12/6  ID - Zosyn 11/23-12/7, D/C  VTE - heparin gtt >> xarelto per pharmacy; pt should NOT have SCD's.  Follow up - Dr. Fredricka Bonineonnor vs Puja in [facility] clinic; Appreciate CM assistance: [facility]/13 at 9:30.  Pt will likely need MATCH letter   Plan: OOB today, D/C PICC line, AM labs, transition to xarelto per pharmacy - appreciate their assistance    LOS: 13 days    Adam PhenixElizabeth S Minela [place] , [facility] 01/10/2017, 10:56 AM Pager: [phone] Consults: [phone] Mon-Fri 7:00 am-4:30 pm Sat-Sun 7:00 am-11:30 am

## 2017-01-11 DIAGNOSIS — I82401 Acute embolism and thrombosis of unspecified deep veins of right lower extremity: Secondary | ICD-10-CM

## 2017-01-11 LAB — CBC
HCT: 39.9 % (ref 39.0–52.0)
Hemoglobin: 13.3 g/dL (ref 13.0–17.0)
MCH: 30.6 pg (ref 26.0–34.0)
MCHC: 33.3 g/dL (ref 30.0–36.0)
MCV: 91.9 fL (ref 78.0–100.0)
PLATELETS: 363 10*3/uL (ref 150–400)
RBC: 4.34 MIL/uL (ref 4.22–5.81)
RDW: 14.9 % (ref 11.5–15.5)
WBC: 9.6 10*3/uL (ref 4.0–10.5)

## 2017-01-11 LAB — GLUCOSE, CAPILLARY
GLUCOSE-CAPILLARY: 104 mg/dL — AB (ref 65–99)
GLUCOSE-CAPILLARY: 108 mg/dL — AB (ref 65–99)
Glucose-Capillary: 112 mg/dL — ABNORMAL HIGH (ref 65–99)
Glucose-Capillary: 120 mg/dL — ABNORMAL HIGH (ref 65–99)

## 2017-01-11 LAB — BASIC METABOLIC PANEL
ANION GAP: 8 (ref 5–15)
BUN: 21 mg/dL — ABNORMAL HIGH (ref 6–20)
CALCIUM: 8.4 mg/dL — AB (ref 8.9–10.3)
CO2: 24 mmol/L (ref 22–32)
Chloride: 100 mmol/L — ABNORMAL LOW (ref 101–111)
Creatinine, Ser: 1.26 mg/dL — ABNORMAL HIGH (ref 0.61–1.24)
GFR calc Af Amer: >60 mL/min (ref >60)
GFR, EST NON AFRICAN AMERICAN: 60 mL/min — AB (ref >60)
GLUCOSE: 127 mg/dL — AB (ref 65–99)
Potassium: 4.8 mmol/L (ref 3.5–5.1)
SODIUM: 132 mmol/L — AB (ref 135–145)

## 2017-01-11 NOTE — Plan of Care (Signed)
Pt progressing

## 2017-01-11 NOTE — Progress Notes (Signed)
14 Days Post-Op   Subjective/Chief Complaint: He reports less right sided abdominal pain Passing flatus Tolerating po   Objective: Vital signs in last 24 hours: Temp:  [97.6 F (36.4 C)-98.7 F (37.1 C)] 98.6 F (37 C) (12/08 0635) Resp:  [16-39] 16 (12/08 0635) BP: (97-131)/(68-92) 131/79 (12/08 0635) SpO2:  [94 %-96 %] 96 % (12/08 0635) Last BM Date: 01/08/17  Intake/Output from previous day: 12/07 0701 - 12/08 0700 In: 604.2 [P.O.:5; I.V.:599.2] Out: 925 [Urine:925] Intake/Output this shift: No intake/output data recorded.  Exam: Looks comfortable Awake and alert Lungs mostly clear Abdomen soft, obese, drain serosang  Lab Results:  Recent Labs    01/09/17 0830 01/11/17 0554  WBC 15.4* 9.6  HGB 12.2* 13.3  HCT 36.6* 39.9  PLT 274 363   BMET Recent Labs    01/09/17 0320 01/11/17 0554  NA 133* 132*  K 4.3 4.8  CL 105 100*  CO2 23 24  GLUCOSE 188* 127*  BUN 20 21*  CREATININE 1.34* 1.26*  CALCIUM 7.8* 8.4*   PT/INR No results for input(s): LABPROT, INR in the last 72 hours. ABG No results for input(s): PHART, HCO3 in the last 72 hours.  Invalid input(s): PCO2, PO2  Studies/Results: [place] Abdomen Limited Ruq  Result Date: 01/10/2017 CLINICAL DATA:  Abdominal pain since yesterday EXAM: ULTRASOUND ABDOMEN LIMITED RIGHT UPPER QUADRANT COMPARISON:  01/02/2017 FINDINGS: Gallbladder: No gallstones or wall thickening visualized. No sonographic Murphy sign noted by sonographer. Common bile duct: Diameter: 3 mm. The duct is difficult to visualize and only partially seen. Liver: Echogenic liver with diminished acoustic penetration. No evidence of mass. Portal vein is patent on color Doppler imaging with normal direction of blood flow towards the liver. IMPRESSION: 1. No acute finding.  Negative gallbladder. 2. Hepatic steatosis. Electronically Signed   By: Marnee SpringJonathon  Watts M.D.   On: 01/10/2017 13:30    Anti-infectives: Anti-infectives (From admission, onward)   Start     Dose/Rate Route Frequency Ordered Stop   12/28/16 0600  piperacillin-tazobactam (ZOSYN) IVPB 3.375 g  Status:  Discontinued     3.375 g 12.5 mL/hr over 240 Minutes Intravenous Every 8 hours 12/28/16 0048 01/10/17 1114   12/28/16 0015  piperacillin-tazobactam (ZOSYN) IVPB 3.375 g     3.375 g 100 mL/hr over 30 Minutes Intravenous  Once 12/28/16 0002 12/28/16 0053      Assessment/Plan: s/p Procedure(s): APPENDECTOMY LAPAROSCOPIC (N/A)  Acute perforated appendicitis with peritonitis POD #14- S/Plaparoscopic appendectomy and placement of drain11/24/18by Dr. Fredricka Bonineonnor - afebrile, VSS, WBC normal today - CT11/4529fluid-filled dilated proximal small bowel with transition zoneinLLQ.No abscess noted. -  ileus improved  -D/C abx today; OOB today  - JP in LLQ, plan to d.c drain prior to discharge   Acute submassive pulmonary embolus - 01/06/17, evidence of R heart strain, echo with EF50-55%  DVT RUE/RLE - no plans for IR IVC filter now HTN - PRN meds here, will need OP F/U with PCP for HTN, DM, and anticoagulation  DM2 - poorly controlled, A1c pending  FEN - SOFT diet, TNA d/c-ed after 12/6  ID - Zosyn 11/23-12/7, D/C  VTE - heparin gtt >> xarelto per pharmacy; pt should NOT have SCD's.  Follow up - Dr. Fredricka Bonineonnor vs Puja in [facility] clinic; Appreciate CM assistance: [facility] for 12/13 at 9:30. Pt will likely need MATCH letter   Continuing current care  [facility] Creek Medical CenterBLACKMAN,Kewanda Poland A 01/11/2017

## 2017-01-11 NOTE — Progress Notes (Signed)
PROGRESS NOTE    Tyler OsierMichael Walbert  WJX:[id number] DOB: 1955/09/02 DOA: 12/27/2016 PCP: Patient, No Pcp Per    Brief Narrative:  61 year old male who was admitted to the surgical service due to acute appendicitis. He was found to have a submassive pulmonary embolism. He was appropriately treated with intravenous heparin with no major complications  Patient hemodynamically stable, will recommend to continue anticoagulation with direct oral anticoagulant, with rivaroxaban. It is presumed to be a provoked DVT - PE then will need at leat 3 mo of full oral anticoagulation.   Assessment & Plan:   Active Problems:   Acute appendicitis   Acute saddle pulmonary embolism with acute cor pulmonale (HCC)   Acute respiratory failure with hypoxia (HCC)   HCAP (healthcare-associated pneumonia)   1.  Acute submassive pulmonary embolism with extensive DVT on the right lower extremity- . Patient tolerating well anticoagulation with rivaroxaban, continue to be hemodynamically stable, systolic blood pressure 113 to 130, oxygenating well at 96% on 3 LPM nasal cannula.   2. Type 2 DM. Toleratingwell glucose cover and monitoring with insulin sliding scale, capillary glucose 134, 122, 104, 108, 112.   3. Obesity. Incentive spirometer. Out of bed to chair.   DVT prophylaxis: rivaroxaban  Code Status: full Family Communication: no family at the bedside Disposition Plan: home   Consultants:     Procedures:     Antimicrobials    Subjective: Patient out of bed to chair, no significant dyspnea, no chest pain, no nausea or vomiting. Positive bowel movements.   Objective: Vitals:   01/11/17 0200 01/11/17 0235 01/11/17 0635 01/11/17 0828  BP:  97/74 131/79 132/81  Pulse:      Resp: (!) 22 18 16  (!) 34  Temp:  97.6 F (36.4 C) 98.6 F (37 C)   TempSrc:  Oral Oral   SpO2:  95% 96%   Weight:      Height:        Intake/Output Summary (Last 24 hours) at 01/11/2017 1523 Last data filed  at 01/11/2017 1029 Gross per 24 hour  Intake 604.17 ml  Output 1125 ml  Net -520.83 ml   Filed Weights   12/27/16 1951  Weight: 131.5 kg (290 lb)    Examination:   General: Not in pain or dyspnea, deconditioned Neurology: Awake and alert, non focal  E ENT: no pallor, no icterus, oral mucosa moist Cardiovascular: No JVD. S1-S2 present, rhythmic, no gallops, rubs, or murmurs. Trace lower extremity edema. Pulmonary: mild breath sounds bilaterally at bases, adequate air movement, no wheezing, rhonchi or rales. Gastrointestinal. Abdomen protuberant, no organomegaly, non tender, no rebound or guarding Skin. No rashes Musculoskeletal: no joint deformities     Data Reviewed: I have personally reviewed following labs and imaging studies  CBC: Recent Labs  Lab 01/06/17 0339 01/07/17 0526 01/08/17 0505 01/09/17 0830 01/11/17 0554  WBC 18.5* 17.2* 18.2* 15.4* 9.6  NEUTROABS 15.0*  --   --   --   --   HGB 13.7 13.0 12.9* 12.2* 13.3  HCT 41.7 40.2 39.6 36.6* 39.9  MCV 92.7 93.5 94.7 92.2 91.9  PLT 331 295 285 274 363   Basic Metabolic Panel: Recent Labs  Lab 01/05/17 0357 01/06/17 0339 01/07/17 0526 01/08/17 0605 01/09/17 0320 01/11/17 0554  NA 138 141 139 137 133* 132*  K 3.8 4.1 4.7 4.3 4.3 4.8  CL 106 113* 108 104 105 100*  CO2 24 23 23 24 23 24   GLUCOSE 153* 189* 209* 197* 188* 127*  BUN  17 20 20  21* 20 21*  CREATININE 1.31* 1.31* 1.31* 1.27* 1.34* 1.26*  CALCIUM 8.1* 8.1* 8.0* 8.0* 7.8* 8.4*  MG 2.3 2.2  --  2.2 2.2  --   PHOS 2.1* 2.6  --  2.8 2.8  --    GFR: Estimated Creatinine Clearance: 85.2 mL/min (A) (by C-G formula based on SCr of 1.26 mg/dL (H)). Liver Function Tests: Recent Labs  Lab 01/06/17 0339 01/09/17 0320  AST 27 24  ALT 40 42  ALKPHOS 56 56  BILITOT 0.6 1.2  PROT 6.0* 6.1*  ALBUMIN 2.1* 1.9*   No results for input(s): LIPASE, AMYLASE in the last 168 hours. No results for input(s): AMMONIA in the last 168 hours. Coagulation  Profile: No results for input(s): INR, PROTIME in the last 168 hours. Cardiac Enzymes: No results for input(s): CKTOTAL, CKMB, CKMBINDEX, TROPONINI in the last 168 hours. BNP (last 3 results) No results for input(s): PROBNP in the last 8760 hours. HbA1C: Recent Labs    01/10/17 1103  HGBA1C 7.4*   CBG: Recent Labs  Lab 01/10/17 0822 01/10/17 1134 01/10/17 1649 01/11/17 0750 01/11/17 1204  GLUCAP 146* 134* 122* 104* 108*   Lipid Profile: No results for input(s): CHOL, HDL, LDLCALC, TRIG, CHOLHDL, LDLDIRECT in the last 72 hours. Thyroid Function Tests: No results for input(s): TSH, T4TOTAL, FREET4, T3FREE, THYROIDAB in the last 72 hours. Anemia Panel: No results for input(s): VITAMINB12, FOLATE, FERRITIN, TIBC, IRON, RETICCTPCT in the last 72 hours.    Radiology Studies: I have reviewed all of the imaging during this hospital visit personally     Scheduled Meds: . docusate sodium  100 mg Oral Daily  . insulin aspart  0-20 Units Subcutaneous TID WC  . insulin aspart  0-5 Units Subcutaneous QHS  . polyethylene glycol  17 g Oral BID  . protein supplement shake  11 oz Oral BID BM  . Rivaroxaban  15 mg Oral BID WC   Continuous Infusions: . 0.9 % NaCl with KCl 20 mEq / L 25 mL/hr at 01/10/17 0032  . chlorproMAZINE (THORAZINE) IV Stopped (01/06/17 0146)     LOS: 14 days        Terrius Gentile Annett Gulaaniel Kei Mcelhiney, MD [facility] Pager [phone]

## 2017-01-12 DIAGNOSIS — I82629 Acute embolism and thrombosis of deep veins of unspecified upper extremity: Secondary | ICD-10-CM

## 2017-01-12 LAB — GLUCOSE, CAPILLARY
GLUCOSE-CAPILLARY: 109 mg/dL — AB (ref 65–99)
GLUCOSE-CAPILLARY: 137 mg/dL — AB (ref 65–99)
GLUCOSE-CAPILLARY: 102 mg/dL — AB (ref 65–99)
GLUCOSE-CAPILLARY: 101 mg/dL — AB (ref 65–99)
Glucose-Capillary: 93 mg/dL (ref 65–99)

## 2017-01-12 LAB — BASIC METABOLIC PANEL
ANION GAP: 8 (ref 5–15)
BUN: 19 mg/dL (ref 6–20)
CHLORIDE: 103 mmol/L (ref 101–111)
CO2: 21 mmol/L — ABNORMAL LOW (ref 22–32)
Calcium: 8.2 mg/dL — ABNORMAL LOW (ref 8.9–10.3)
Creatinine, Ser: 1.06 mg/dL (ref 0.61–1.24)
GFR calc Af Amer: >60 mL/min (ref >60)
GLUCOSE: 129 mg/dL — AB (ref 65–99)
POTASSIUM: 4.6 mmol/L (ref 3.5–5.1)
SODIUM: 132 mmol/L — AB (ref 135–145)

## 2017-01-12 NOTE — Discharge Instructions (Addendum)
Please arrive at least 30 min before your appointment to complete your check in paperwork.  If you are unable to arrive 30 min prior to your appointment time we may have to cancel or reschedule you.    LAPAROSCOPIC SURGERY: POST OP INSTRUCTIONS  1. DIET: Follow a light bland diet the first 24 hours after arrival home, such as soup, liquids, crackers, etc. Be sure to include lots of fluids daily. Avoid fast food or heavy meals as your are more likely to get nauseated. Eat a low fat the next few days after surgery.  2. Take your usually prescribed home medications unless otherwise directed. 3. PAIN CONTROL:  1. Pain is best controlled by a usual combination of three different methods TOGETHER:  1. Ice/Heat 2. Over the counter pain medication 3. Prescription pain medication 2. Most patients will experience some swelling and bruising around the incisions. Ice packs or heating pads (30-60 minutes up to 6 times a day) will help. Use ice for the first few days to help decrease swelling and bruising, then switch to heat to help relax tight/sore spots and speed recovery. Some people prefer to use ice alone, heat alone, alternating between ice & heat. Experiment to what works for you. Swelling and bruising can take several weeks to resolve.  3. It is helpful to take an over-the-counter pain medication regularly for the first few weeks. Choose one of the following that works best for you:  1. Naproxen (Aleve, etc) Two 220mg  tabs twice a day 2. Ibuprofen (Advil, etc) Three 200mg  tabs four times a day (every meal & bedtime) 3. Acetaminophen (Tylenol, etc) 500-650mg  four times a day (every meal & bedtime) 4. A prescription for pain medication (such as oxycodone, hydrocodone, etc) should be given to you upon discharge. Take your pain medication as prescribed.  1. If you are having problems/concerns with the prescription medicine (does not control pain, nausea, vomiting, rash, itching, etc), please call us [phone]  [phone] to see if we need to switch you to a different pain medicine that will work better for you and/or control your side effect better. 2. If you need a refill on your pain medication, please contact your pharmacy. They will contact our office to request authorization. Prescriptions will not be filled after 5 pm or on week-ends. 4. Avoid getting constipated. Between the surgery and the pain medications, it is common to experience some constipation. Increasing fluid intake and taking a fiber supplement (such as Metamucil, Citrucel, FiberCon, MiraLax, etc) 1-2 times a day regularly will usually help prevent this problem from occurring. A mild laxative (prune juice, Milk of Magnesia, MiraLax, etc) should be taken according to package directions if there are no bowel movements after 48 hours.  5. Watch out for diarrhea. If you have many loose bowel movements, simplify your diet to bland foods & liquids for a few days. Stop any stool softeners and decrease your fiber supplement. Switching to mild anti-diarrheal medications (Kayopectate, Pepto Bismol) can help. If this worsens or does not improve, please call us. 6. Wash / shower every day. You may shower over the dressings as they are waterproof. Continue to shower over incision(s) after the dressing is off. 7. Remove your waterproof bandages 5 days after surgery. You may leave the incision open to air. You may replace a dressing/Band-Aid to cover the incision for comfort if you wish.  8. ACTIVITIES as tolerated:  1. You may resume regular (light) daily activities beginning the next day--such as daily self-care, walking,  climbing stairs--gradually increasing activities as tolerated. If you can walk 30 minutes without difficulty, it is safe to try more intense activity such as jogging, treadmill, bicycling, low-impact aerobics, swimming, etc. 2. Save the most intensive and strenuous activity for last such as sit-ups, heavy lifting, contact sports, etc Refrain  from any heavy lifting or straining until you are off narcotics for pain control.  3. DO NOT PUSH THROUGH PAIN. Let pain be your guide: If it hurts to do something, don't do it. Pain is your body warning you to avoid that activity for another week until the pain goes down. 4. You may drive when you are no longer taking prescription pain medication, you can comfortably wear a seatbelt, and you can safely maneuver your car and apply brakes. 5. You may have sexual intercourse when it is comfortable.  9. FOLLOW UP in our office  1. Please call [facility] at [phone] to set up an appointment to see your surgeon in the office for a follow-up appointment approximately 2-3 weeks after your surgery. 2. Make sure that you call for this appointment the day you arrive home to insure a convenient appointment time.      10. IF YOU HAVE DISABILITY OR FAMILY LEAVE FORMS, BRING THEM TO THE               OFFICE FOR PROCESSING.   WHEN TO CALL us [phone]:  1. Poor pain control 2. Reactions / problems with new medications (rash/itching, nausea, etc)  3. Fever over 101.5 F (38.5 C) 4. Inability to urinate 5. Nausea and/or vomiting 6. Worsening swelling or bruising 7. Continued bleeding from incision. 8. Increased pain, redness, or drainage from the incision  The clinic staff is available to answer your questions during regular business hours (8:30am-5pm). Please dont hesitate to call and ask to speak to one of our nurses for clinical concerns.  If you have a medical emergency, go to the nearest emergency room or call 911.  A surgeon from [facility] is always on call at the [facility], [place]  [address], [address], [place], [place] [address] ?  MAIN: [phone] ? TOLL FREE: [phone] ?  FAX [phone]  [email or web address]  Information on my medicine - XARELTO (rivaroxaban)  This medication education was reviewed with me or my  healthcare representative as part of my discharge preparation.  The pharmacist that spoke with me during my hospital stay was:  Adline Potter, [facility]  WHY WAS XARELTO PRESCRIBED FOR YOU? Xarelto was prescribed to treat blood clots that may have been found in the veins of your legs (deep vein thrombosis) or in your lungs (pulmonary embolism) and to reduce the risk of them occurring again.  What do you need to know about Xarelto? The starting dose is one 15 mg tablet taken TWICE daily with food for the FIRST 21 DAYS then on 01/31/2017  the dose is changed to one 20 mg tablet taken ONCE A DAY with your evening meal.  DO NOT stop taking Xarelto without talking to the health care provider who prescribed the medication.  Refill your prescription for 20 mg tablets before you run out.  After discharge, you should have regular check-up appointments with your healthcare provider that is prescribing your Xarelto.  In the future your dose may need to be changed if your kidney function changes by a significant amount.  What do you do if you miss a dose? If you  are taking Xarelto TWICE DAILY and you miss a dose, take it as soon as you remember. You may take two 15 mg tablets (total 30 mg) at the same time then resume your regularly scheduled 15 mg twice daily the next day.  If you are taking Xarelto ONCE DAILY and you miss a dose, take it as soon as you remember on the same day then continue your regularly scheduled once daily regimen the next day. Do not take two doses of Xarelto at the same time.   Important Safety Information Xarelto is a blood thinner medicine that can cause bleeding. You should call your healthcare provider right away if you experience any of the following: ? Bleeding from an injury or your nose that does not stop. ? Unusual colored urine (red or dark brown) or unusual colored stools (red or black). ? Unusual bruising for unknown reasons. ? A serious fall or if you hit your head  (even if there is no bleeding).  Some medicines may interact with Xarelto and might increase your risk of bleeding while on Xarelto. To help avoid this, consult your healthcare provider or pharmacist prior to using any new prescription or non-prescription medications, including herbals, vitamins, non-steroidal anti-inflammatory drugs (NSAIDs) and supplements.  This website has more information on Xarelto: [email or web address].

## 2017-01-12 NOTE — Progress Notes (Signed)
15 Days Post-Op   Subjective/Chief Complaint: Reports less abdominal discomfort Passing flatus   Objective: Vital signs in last 24 hours: Temp:  [97.6 F (36.4 C)-98.7 F (37.1 C)] 98 F (36.7 C) (12/09 0814) Resp:  [18-25] 25 (12/09 0654) BP: (113-127)/(75-78) 127/78 (12/09 0654) Last BM Date: 01/11/17  Intake/Output from previous day: 12/08 0701 - 12/09 0700 In: 732.5 [P.O.:120; I.V.:612.5] Out: 850 [Urine:850] Intake/Output this shift: Total I/O In: 60 [P.O.:60] Out: -   Exam: Looks comfortable Abdomen softer, less tender  Lab Results:  Recent Labs    01/11/17 0554  WBC 9.6  HGB 13.3  HCT 39.9  PLT 363   BMET Recent Labs    01/11/17 0554  NA 132*  K 4.8  CL 100*  CO2 24  GLUCOSE 127*  BUN 21*  CREATININE 1.26*  CALCIUM 8.4*   PT/INR No results for input(s): LABPROT, INR in the last 72 hours. ABG No results for input(s): PHART, HCO3 in the last 72 hours.  Invalid input(s): PCO2, PO2  Studies/Results: Koreas Abdomen Limited Ruq  Result Date: 01/10/2017 CLINICAL DATA:  Abdominal pain since yesterday EXAM: ULTRASOUND ABDOMEN LIMITED RIGHT UPPER QUADRANT COMPARISON:  01/02/2017 FINDINGS: Gallbladder: No gallstones or wall thickening visualized. No sonographic Murphy sign noted by sonographer. Common bile duct: Diameter: 3 mm. The duct is difficult to visualize and only partially seen. Liver: Echogenic liver with diminished acoustic penetration. No evidence of mass. Portal vein is patent on color Doppler imaging with normal direction of blood flow towards the liver. IMPRESSION: 1. No acute finding.  Negative gallbladder. 2. Hepatic steatosis. Electronically Signed   By: Marnee SpringJonathon  Watts M.D.   On: 01/10/2017 13:30    Anti-infectives: Anti-infectives (From admission, onward)   Start     Dose/Rate Route Frequency Ordered Stop   12/28/16 0600  piperacillin-tazobactam (ZOSYN) IVPB 3.375 g  Status:  Discontinued     3.375 g 12.5 mL/hr over 240 Minutes  Intravenous Every 8 hours 12/28/16 0048 01/10/17 1114   12/28/16 0015  piperacillin-tazobactam (ZOSYN) IVPB 3.375 g     3.375 g 100 mL/hr over 30 Minutes Intravenous  Once 12/28/16 0002 12/28/16 0053      Assessment/Plan: s/p Procedure(s): APPENDECTOMY LAPAROSCOPIC (N/A) Postop PE  Continue anticoag meds Ileus slowly resolving Encourage po  LOS: 15 days    Tyler Franklin A 01/12/2017

## 2017-01-12 NOTE — Progress Notes (Signed)
PROGRESS NOTE    Eduardo OsierMichael Behe  WUJ:[id number] DOB: 1955-07-13 DOA: 12/27/2016 PCP: Patient, No Pcp Per    Brief Narrative:  61 year old male who was admitted to the surgical service due to acute appendicitis. He was found to have a submassive pulmonary embolism. He was appropriately treated with intravenous heparin with no major complications  Patient hemodynamically stable, will recommend to continue anticoagulation with direct oral anticoagulant, with rivaroxaban. It is presumed to be a provoked DVT - PE then will need at leat 3 mo of full oral anticoagulation.   Assessment & Plan:   Active Problems:   Acute appendicitis   Acute saddle pulmonary embolism with acute cor pulmonale (HCC)   Acute respiratory failure with hypoxia (HCC)   HCAP (healthcare-associated pneumonia)   1.  Acute submassive pulmonary embolism with extensive DVT on the right lower & upper extremities- . Patient tolerating well anticoagulation with rivaroxaban, continue to be hemodynamically stable, systolic blood pressure 113 to 130, oxygenating well at 96% on 3 LPM nasal cannula.   2. Type 2 DM. Toleratingwell glucose cover and monitoring with insulin sliding scale  3. Obesity. Incentive spirometer. Out of bed to chair.   DVT prophylaxis: rivaroxaban  Code Status: full Family Communication: no family at the bedside Disposition Plan: home   Consultants:     Procedures:     Antimicrobials Zosyn 11/24-12/7   Subjective: Patient out of bed to chair, no significant dyspnea, no chest pain, no nausea or vomiting. Positive bowel movements.   Objective: Vitals:   01/12/17 0254 01/12/17 0654 01/12/17 0800 01/12/17 0814  BP:  127/78    Pulse:      Resp:  (!) 25 10   Temp: 98.7 F (37.1 C) 98.4 F (36.9 C)  98 F (36.7 C)  TempSrc: Oral Oral    SpO2:      Weight:      Height:        Intake/Output Summary (Last 24 hours) at 01/12/2017 1131 Last data filed at 01/12/2017 0912 Gross per  24 hour  Intake 912.5 ml  Output 650 ml  Net 262.5 ml   Filed Weights   12/27/16 1951  Weight: 131.5 kg (290 lb)    Examination:   General: NAD Neurology: Awake and alert, non focal  E ENT: no pallor, no icterus, oral mucosa moist Cardiovascular: No JVD. S1-S2 present, rhythmic, no gallops, rubs, or murmurs. Trace lower extremity edema. Pulmonary: mild breath sounds bilaterally at bases, adequate air movement, no wheezing, rhonchi or rales. Gastrointestinal. Abdomen protuberant, no organomegaly, non tender, no rebound or guarding Skin. No rashes Musculoskeletal: no joint deformities     Data Reviewed: I have personally reviewed following labs and imaging studies  CBC: Recent Labs  Lab 01/06/17 0339 01/07/17 0526 01/08/17 0505 01/09/17 0830 01/11/17 0554  WBC 18.5* 17.2* 18.2* 15.4* 9.6  NEUTROABS 15.0*  --   --   --   --   HGB 13.7 13.0 12.9* 12.2* 13.3  HCT 41.7 40.2 39.6 36.6* 39.9  MCV 92.7 93.5 94.7 92.2 91.9  PLT 331 295 285 274 363   Basic Metabolic Panel: Recent Labs  Lab 01/06/17 0339 01/07/17 0526 01/08/17 0605 01/09/17 0320 01/11/17 0554  NA 141 139 137 133* 132*  K 4.1 4.7 4.3 4.3 4.8  CL 113* 108 104 105 100*  CO2 23 23 24 23 24   GLUCOSE 189* 209* 197* 188* 127*  BUN 20 20 21* 20 21*  CREATININE 1.31* 1.31* 1.27* 1.34* 1.26*  CALCIUM 8.1*  8.0* 8.0* 7.8* 8.4*  MG 2.2  --  2.2 2.2  --   PHOS 2.6  --  2.8 2.8  --    GFR: Estimated Creatinine Clearance: 85.2 mL/min (A) (by C-G formula based on SCr of 1.26 mg/dL (H)). Liver Function Tests: Recent Labs  Lab 01/06/17 0339 01/09/17 0320  AST 27 24  ALT 40 42  ALKPHOS 56 56  BILITOT 0.6 1.2  PROT 6.0* 6.1*  ALBUMIN 2.1* 1.9*   No results for input(s): LIPASE, AMYLASE in the last 168 hours. No results for input(s): AMMONIA in the last 168 hours. Coagulation Profile: No results for input(s): INR, PROTIME in the last 168 hours. Cardiac Enzymes: No results for input(s): CKTOTAL, CKMB,  CKMBINDEX, TROPONINI in the last 168 hours. BNP (last 3 results) No results for input(s): PROBNP in the last 8760 hours. HbA1C: Recent Labs    01/10/17 1103  HGBA1C 7.4*   CBG: Recent Labs  Lab 01/11/17 0750 01/11/17 1204 01/11/17 1726 01/11/17 2139 01/12/17 0813  GLUCAP 104* 108* 112* 120* 109*   Lipid Profile: No results for input(s): CHOL, HDL, LDLCALC, TRIG, CHOLHDL, LDLDIRECT in the last 72 hours. Thyroid Function Tests: No results for input(s): TSH, T4TOTAL, FREET4, T3FREE, THYROIDAB in the last 72 hours. Anemia Panel: No results for input(s): VITAMINB12, FOLATE, FERRITIN, TIBC, IRON, RETICCTPCT in the last 72 hours.    Radiology Studies: I have reviewed all of the imaging during this hospital visit personally     Scheduled Meds: . docusate sodium  100 mg Oral Daily  . insulin aspart  0-20 Units Subcutaneous TID WC  . insulin aspart  0-5 Units Subcutaneous QHS  . polyethylene glycol  17 g Oral BID  . protein supplement shake  11 oz Oral BID BM  . Rivaroxaban  15 mg Oral BID WC   Continuous Infusions: . 0.9 % NaCl with KCl 20 mEq / L 25 mL/hr at 01/12/17 0238  . chlorproMAZINE (THORAZINE) IV Stopped (01/06/17 0146)     LOS: 15 days        Haydee SalterPhillip M Melisssa Donner, MD [facility] Pager AMION

## 2017-01-13 DIAGNOSIS — I2699 Other pulmonary embolism without acute cor pulmonale: Secondary | ICD-10-CM

## 2017-01-13 LAB — GLUCOSE, CAPILLARY
GLUCOSE-CAPILLARY: 130 mg/dL — AB (ref 65–99)
GLUCOSE-CAPILLARY: 93 mg/dL (ref 65–99)
Glucose-Capillary: 121 mg/dL — ABNORMAL HIGH (ref 65–99)
Glucose-Capillary: 111 mg/dL — ABNORMAL HIGH (ref 65–99)
Glucose-Capillary: 105 mg/dL — ABNORMAL HIGH (ref 65–99)

## 2017-01-13 NOTE — Progress Notes (Signed)
16 Days Post-Op   Subjective/Chief Complaint: Having more BM's Tolerating po.  Intake improving   Objective: Vital signs in last 24 hours: Temp:  [97.3 F (36.3 C)-98.7 F (37.1 C)] 97.4 F (36.3 C) (12/10 0648) Pulse Rate:  [84] 84 (12/09 1053) Resp:  [10] 10 (12/09 0800) BP: (110-117)/(70-74) 110/74 (12/09 2200) SpO2:  [96 %-98 %] 96 % (12/09 2200) Last BM Date: 01/11/17  Intake/Output from previous day: 12/09 0701 - 12/10 0700 In: 893.3 [P.O.:600; I.V.:293.3] Out: 1852 [Urine:1845; Drains:5; Stool:2] Intake/Output this shift: No intake/output data recorded.  Exam: Abdomen soft, minimally tender Drain serosang  Lab Results:  Recent Labs    01/11/17 0554  WBC 9.6  HGB 13.3  HCT 39.9  PLT 363   BMET Recent Labs    01/11/17 0554 01/12/17 1016  NA 132* 132*  K 4.8 4.6  CL 100* 103  CO2 24 21*  GLUCOSE 127* 129*  BUN 21* 19  CREATININE 1.26* 1.06  CALCIUM 8.4* 8.2*   PT/INR No results for input(s): LABPROT, INR in the last 72 hours. ABG No results for input(s): PHART, HCO3 in the last 72 hours.  Invalid input(s): PCO2, PO2  Studies/Results: No results found.  Anti-infectives: Anti-infectives (From admission, onward)   Start     Dose/Rate Route Frequency Ordered Stop   12/28/16 0600  piperacillin-tazobactam (ZOSYN) IVPB 3.375 g  Status:  Discontinued     3.375 g 12.5 mL/hr over 240 Minutes Intravenous Every 8 hours 12/28/16 0048 01/10/17 1114   12/28/16 0015  piperacillin-tazobactam (ZOSYN) IVPB 3.375 g     3.375 g 100 mL/hr over 30 Minutes Intravenous  Once 12/28/16 0002 12/28/16 0053      Assessment/Plan: s/p Procedure(s): APPENDECTOMY LAPAROSCOPIC (N/A)  Ileus continuing to improve Hopefully home in next 24 to 48 hours depending on the weather  LOS: 16 days    Naylani Bradner A 01/13/2017

## 2017-01-13 NOTE — Plan of Care (Signed)
Patient has progress well through night.  She rested well and pain was manageable with ordered pain meds.

## 2017-01-13 NOTE — Progress Notes (Signed)
ANTICOAGULATION CONSULT NOTE - Follow Up Consult  Pharmacy Consult for  Xarelto Indication: new PE/ DVT  No Known Allergies  Patient Measurements: Height: 5\' 11"  (180.3 cm) Weight: 290 lb (131.5 kg) IBW/kg (Calculated) : 75.3  Vital Signs: Temp: 98.3 F (36.8 C) (12/10 0905) Temp Source: Oral (12/10 0905)  Labs: Recent Labs    01/11/17 0554 01/12/17 1016  HGB 13.3  --   HCT 39.9  --   PLT 363  --   CREATININE 1.26* 1.06    Estimated Creatinine Clearance: 101.2 mL/min (by C-G formula based on SCr of 1.06 mg/dL).  Assessment: 61 yr old with new PE and DVT on 01/07/17 and transitioned from IV heparin to Xarelto. No bleeding noted, last CBC normal on 12/8.  Goal of Therapy:  appropriate Xarelto dose for indication Monitor platelets by anticoagulation protocol: Yes   Plan:  Xarelto 15 mg BID x 21 days (thru 12/27) then 20 mg daily with supper Pharmacy signing off but will continue to follow peripherally    Loura BackJennifer Conyers, PharmD, BCPS Clinical Pharmacist Phone for today [phone] Main pharmacy - [phone] 01/13/2017 10:57 AM

## 2017-01-13 NOTE — Progress Notes (Signed)
Patient tolerating well anticoagulation with Rivaroxaban. Provoked deep venous thrombosis related to acute illness. Will recommend 3 to 6 months of therapy, 15 mg twice daily for 21 days, then continue 20 mg daily with meals. It is advised to repeat vascular venous ultrasonography of the right lower extremity at 3 mo.   Will continue to follow from the distance.

## 2017-01-14 LAB — GLUCOSE, CAPILLARY
GLUCOSE-CAPILLARY: 114 mg/dL — AB (ref 65–99)
Glucose-Capillary: 137 mg/dL — ABNORMAL HIGH (ref 65–99)
Glucose-Capillary: 102 mg/dL — ABNORMAL HIGH (ref 65–99)
Glucose-Capillary: 110 mg/dL — ABNORMAL HIGH (ref 65–99)

## 2017-01-14 NOTE — Plan of Care (Signed)
Patient progressing 

## 2017-01-14 NOTE — Progress Notes (Signed)
   01/14/17 1516  Mobility  Activity Ambulated in hall  Level of Assistance Independent after set-up  Assistive Device Front wheel walker  Mobility Response Tolerated well  No complaints offered, gait steady, use of walker in hall, pt tolerated activity well.   Lennox SoldersShelia Paris Hohn, RN

## 2017-01-14 NOTE — Progress Notes (Signed)
17 Days Post-Op   Subjective/Chief Complaint: Tolerating po Having bm's Has not been ambulating   Objective: Vital signs in last 24 hours: Temp:  [98.1 F (36.7 C)-98.3 F (36.8 C)] 98.1 F (36.7 C) (12/11 0440) Pulse Rate:  [87-112] 112 (12/10 2250) Resp:  [17-34] 17 (12/11 0601) BP: (111-125)/(62-91) 111/63 (12/11 0601) SpO2:  [91 %-98 %] 97 % (12/11 0601) Last BM Date: 01/13/17  Intake/Output from previous day: 12/10 0701 - 12/11 0700 In: 482 [P.O.:482] Out: 800 [Urine:600; Drains:200] Intake/Output this shift: No intake/output data recorded.  Exam: Lungs clear Abdomen soft, NT  Lab Results:  No results for input(s): WBC, HGB, HCT, PLT in the last 72 hours. BMET Recent Labs    01/12/17 1016  NA 132*  K 4.6  CL 103  CO2 21*  GLUCOSE 129*  BUN 19  CREATININE 1.06  CALCIUM 8.2*   PT/INR No results for input(s): LABPROT, INR in the last 72 hours. ABG No results for input(s): PHART, HCO3 in the last 72 hours.  Invalid input(s): PCO2, PO2  Studies/Results: No results found.  Anti-infectives: Anti-infectives (From admission, onward)   Start     Dose/Rate Route Frequency Ordered Stop   12/28/16 0600  piperacillin-tazobactam (ZOSYN) IVPB 3.375 g  Status:  Discontinued     3.375 g 12.5 mL/hr over 240 Minutes Intravenous Every 8 hours 12/28/16 0048 01/10/17 1114   12/28/16 0015  piperacillin-tazobactam (ZOSYN) IVPB 3.375 g     3.375 g 100 mL/hr over 30 Minutes Intravenous  Once 12/28/16 0002 12/28/16 0053      Assessment/Plan: s/p Procedure(s): APPENDECTOMY LAPAROSCOPIC (N/A)   Ileus resolved Ambulate Hopefully home tomorrow  LOS: 17 days    Mecca Barga A 01/14/2017

## 2017-01-15 LAB — GLUCOSE, CAPILLARY
GLUCOSE-CAPILLARY: 103 mg/dL — AB (ref 65–99)
Glucose-Capillary: 125 mg/dL — ABNORMAL HIGH (ref 65–99)

## 2017-01-15 MED ORDER — RIVAROXABAN 15 MG PO TABS: 15.0000 mg | ORAL_TABLET | Freq: Two times a day (BID) | ORAL | 0 refills | Status: DC

## 2017-01-15 MED ORDER — RIVAROXABAN 20 MG PO TABS: 20.0000 mg | ORAL_TABLET | Freq: Every day | ORAL | 0 refills | Status: DC

## 2017-01-15 MED FILL — XARELTO 15 MG TABLET: 15 | 15 days supply | Qty: 30 | Fill #0

## 2017-01-15 NOTE — Discharge Summary (Signed)
[facility] Discharge Summary   Patient ID: Eduardo OsierMichael Shrake MRN: [id number] DOB/AGE: 1955-04-16 61 y.o.  Admit date: 12/27/2016 Discharge date: 01/15/2017  Admitting Diagnosis: Acute appendicitis  Discharge Diagnosis Patient Active Problem List   Diagnosis Date Noted  . Acute saddle pulmonary embolism with acute cor pulmonale (HCC)   . Acute respiratory failure with hypoxia (HCC)   . HCAP (healthcare-associated pneumonia)   . Acute appendicitis 12/28/2016    Consultants Critical Care, Joneen RoachPaul Hoffman, [place] Internal Medicine, Marlowe KaysSara Wertman, [place] Interventional Radiology, Barnetta ChapelKelly Osborne, [place]  Imaging: No results found.  Procedures Dr. Fredricka Bonineonnor (12/28/16) - Laparoscopic Appendectomy with drain placement  HPI: Pt is a 61 year old man with a history of diabetes who presents with a 1-2 day history of lower abdominal pain. The pain began vaguely on Wednesday but worsened yesterday after Thanksgiving dinner and has localized to the right lower quadrant. No associated nausea or vomiting. No diarrhea. Pain does not radiate. He initially presented to urgent care where he was found to have a low-grade temperature and given concerns for appendicitis he is referred to the emergency department. Here he has undergone labs revealing a leukocytosis and CT scan consistent with acute appendicitis as listed below. He has never had any abdominal surgery.   Hospital Course:  Patient was admitted and underwent procedure listed above. Post-op findings included ruptured appendix with generalized peritonitis. Tolerated procedure well and was transferred to the floor. Hospital course was complicated by a prolonged ileus and NGT was placed on 11/29. TPN was started on 11/30. NGT was removed on 12/03. Chest CT showed possible pneumonia. Pt already on Zosyn. Ileus resolved and diet was advanced as tolerated. Pt was noted to have worsening leukocytosis. Chest xray on 12/03 showed increase in consolidation  of right base with small right pleural effusion. On 12/03, pt had acute desaturation into the 80's with diaphoresis so CT angio was ordered and showed acute PE. Pt was transferred to ICU and a heparin drip was started. Pulmonary critical care was consulted. Dopplers of extremities were performed and were positive for RUE DVT of right brachial and basilic vein surrounding the PICC line. Also positive for RLE DVT in right distal femoral vein, right popliteal and right posterior tibial veins. Pt was moved to SDU on 12/06. Interventional Radiology (IR) was consulted for IVC filter. Medicine was consulted for anticoagulation management. TPN was weaned and PICC was removed. IR recommended IVC filter not be placed. Pt had RUQ pain on 12/07 so US of RUQ was performed and it showed no acute findings. Antibiotics were stopped on 12/07. On POD#18, the patient was voiding well, tolerating diet, ambulating well, pain well controlled, vital signs stable, incisions c/d/i, drain removed and felt stable for discharge home.  Patient will follow up in our office in 2 weeks and knows to call with questions or concerns.  He knows to see PCP on 12/13 for assistance with anticoagulation medication.  Patient was discharged in good condition.  The [place] Substance controlled database was reviewed prior to prescribing narcotic pain medication to this patient.  Physical Exam: Gen:  Alert, NAD, pleasant, cooperative Card:  RRR, no M/G/R heard, 2 + DP pulses bilaterally Pulm:  CTA, no W/R/R, effort normal Abd: Soft, NT/ND, +BS, incisions well healing, drain with minimal sanguinous drainage Skin: no rashes noted, warm and dry Extremities: no edema to BLE, no TTP or swelling to calves b/l  Allergies as of 01/15/2017   No Known Allergies     Medication List  TAKE these medications   ONE-A-DAY MENS PO Take 1 tablet by mouth daily.   Rivaroxaban 15 MG Tabs tablet Commonly known as:  XARELTO Take 1 tablet (15 mg  total) by mouth 2 (two) times daily with a meal for 15 days. Through 12/27 then 20mg  daily with supper. Take first dose at 5:00PM today with dinner 01/15/2017   rivaroxaban 20 MG Tabs tablet Commonly known as:  XARELTO Take 1 tablet (20 mg total) by mouth daily with supper.        Follow-up Information    [facility], [place]. Call.   Specialty:  General Surgery Why:  we are setting up a follow up appointment for you. Please call our office to see when your appointment is. Contact information: [address] [place] [id number] [phone]       [facility]. Go on 01/16/2017.   Why:   Post hospital follow up scheduled for 12/13 at 9:30 am  Contact information: [address] [place] [phone]          Signed: Joyce CopaJessica L [facility] 01/15/2017, 2:28 PM Pager: [phone] Consults: [phone] Mon-Fri 7:00 am-4:30 pm Sat-Sun 7:00 am-11:30 am

## 2017-01-15 NOTE — Progress Notes (Addendum)
Pt states get Rx filled @ [facility], [address] [place], [place] [address] [phone]. CM called and confirmed with pharmacy they do have  Xaralto in stock. CM provided pt with Xaralto 30 day free card with explanation. Gae GallopAngela Rayden Dock RN,BSN,CM

## 2017-01-15 NOTE — Progress Notes (Addendum)
Discharge instructions reviewed with pt and his sister.  Copy of instructions and scripts given to pt.  Pt d/c'd via wheelchair with belongings, with family.          Escorted by unit NT.  Instructed by care management that [facility] and have filled there, pt informed and will take there today or in the am he stated.

## 2017-01-15 NOTE — Progress Notes (Signed)
[facility] Progress Note  18 Days Post-Op   Assessment/Plan Acute perforated appendicitis with peritonitis POD #13- S/Plaparoscopic appendectomy and placement of drain11/24/18by Dr. Fredricka Bonineonnor  - ileus resolved  - JP in LLQ, plan to d.c drain prior to discharge   Acute submassive pulmonary embolus - 01/06/17, evidence of R heart strain, echo with EF50-55%  DVT RUE/RLE - medicine consulted. IR consulted for possible IVC filter and said it was not necessary. Both services recommended continuation chemical anticoagulation.  HTN - PRN meds here, will need OP F/U with PCP for HTN, DM, and anticoagulation  DM2 - poorly controlled, A1c pending  FEN - SOFT diet, TNA d/c-ed after 12/6  ID - Zosyn 11/23-12/7, D/C  VTE - Xarelto 15 mg BID x 21 days (thru 12/27) then 20 mg daily with supper Follow up - [facility]; [facility] 12/13 at 9:30. Pt will likely need MATCH letter   Plan: OOB today, xarelto, likely home today, case management asked to help with xarelto costs.    LOS: 18 days    Subjective:  Pt has no abdominal pain. He had a normal BM yesterday. He was out of bed walking yesterday. He needs a work note. He is ready to go home.   Objective: Vital signs in last 24 hours: Temp:  [98 F (36.7 C)-98.3 F (36.8 C)] 98.1 F (36.7 C) (12/12 0746) Pulse Rate:  [80-84] 84 (12/12 0708) Resp:  [21-22] 21 (12/11 1755) BP: (106-127)/(59-89) 116/89 (12/12 0708) SpO2:  [93 %-97 %] 97 % (12/12 0708) Last BM Date: 01/14/17  Intake/Output from previous day: 12/11 0701 - 12/12 0700 In: 420 [P.O.:420] Out: 402 [Urine:401; Stool:1] Intake/Output this shift: No intake/output data recorded.  PE: Gen:  Alert, NAD, pleasant, cooperative Card:  RRR, no M/G/R heard, 2 + DP pulses bilaterally Pulm:  CTA, no W/R/R, effort normal Abd: Soft, NT/ND, +BS, incisions well healing, drain with minimal sanguinous drainage Skin: no rashes noted, warm  and dry Extremities: no edema to BLE, no TTP or swelling to calves b/l   Anti-infectives: Anti-infectives (From admission, onward)   Start     Dose/Rate Route Frequency Ordered Stop   12/28/16 0600  piperacillin-tazobactam (ZOSYN) IVPB 3.375 g  Status:  Discontinued     3.375 g 12.5 mL/hr over 240 Minutes Intravenous Every 8 hours 12/28/16 0048 01/10/17 1114   12/28/16 0015  piperacillin-tazobactam (ZOSYN) IVPB 3.375 g     3.375 g 100 mL/hr over 30 Minutes Intravenous  Once 12/28/16 0002 12/28/16 0053      Lab Results:  No results for input(s): WBC, HGB, HCT, PLT in the last 72 hours. BMET Recent Labs    01/12/17 1016  NA 132*  K 4.6  CL 103  CO2 21*  GLUCOSE 129*  BUN 19  CREATININE 1.06  CALCIUM 8.2*   PT/INR No results for input(s): LABPROT, INR in the last 72 hours. CMP     Component Value Date/Time   NA 132 (L) 01/12/2017 1016   K 4.6 01/12/2017 1016   CL 103 01/12/2017 1016   CO2 21 (L) 01/12/2017 1016   GLUCOSE 129 (H) 01/12/2017 1016   BUN 19 01/12/2017 1016   CREATININE 1.06 01/12/2017 1016   CALCIUM 8.2 (L) 01/12/2017 1016   PROT 6.1 (L) 01/09/2017 0320   ALBUMIN 1.9 (L) 01/09/2017 0320   AST 24 01/09/2017 0320   ALT 42 01/09/2017 0320   ALKPHOS 56 01/09/2017 0320   BILITOT 1.2 01/09/2017  0320   GFRNONAA >60 01/12/2017 1016   GFRAA >60 01/12/2017 1016   Lipase     Component Value Date/Time   LIPASE 15 12/27/2016 2015    Studies/Results: No results found.    Jerre SimonJessica L Avyon Herendeen , [facility] 01/15/2017, 9:20 AM Pager: [phone] Consults: [phone] Mon-Fri 7:00 am-4:30 pm Sat-Sun 7:00 am-11:30 am

## 2017-01-16 ENCOUNTER — Encounter: Payer: Self-pay | Admitting: Family Medicine

## 2017-01-16 ENCOUNTER — Ambulatory Visit (INDEPENDENT_AMBULATORY_CARE_PROVIDER_SITE_OTHER): Payer: Self-pay | Admitting: Family Medicine

## 2017-01-16 VITALS — BP 112/68 | HR 103 | Temp 97.9°F | Resp 16 | Ht 70.0 in | Wt 262.0 lb

## 2017-01-16 DIAGNOSIS — Z23 Encounter for immunization: Secondary | ICD-10-CM

## 2017-01-16 DIAGNOSIS — Z86711 Personal history of pulmonary embolism: Secondary | ICD-10-CM

## 2017-01-16 DIAGNOSIS — E119 Type 2 diabetes mellitus without complications: Secondary | ICD-10-CM

## 2017-01-16 DIAGNOSIS — R7989 Other specified abnormal findings of blood chemistry: Secondary | ICD-10-CM

## 2017-01-16 LAB — POCT GLYCOSYLATED HEMOGLOBIN (HGB A1C): Hemoglobin A1C: 7.1

## 2017-01-16 LAB — GLUCOSE, CAPILLARY: Glucose-Capillary: 171 mg/dL — ABNORMAL HIGH (ref 65–99)

## 2017-01-16 MED ORDER — METFORMIN HCL 500 MG PO TABS: 500.0000 mg | ORAL_TABLET | Freq: Two times a day (BID) | ORAL | 5 refills | Status: DC

## 2017-01-16 MED FILL — ?METFORMIN HCL 500MG TABLET: 500 | 30 days supply | Qty: 60 | Fill #0

## 2017-01-16 NOTE — Progress Notes (Signed)
Subjective:    Patient ID: Tyler Franklin, male    DOB: [birth year], 61 y.o.   MRN: [id number]  HPI  Tyler OsierMichael Kulkarni, a 61 year old male presents accompanied by daughter to establish care. Patient states that he has primarily been using the emergency department for all primary needs.  Patient was recently admitted to inpatient services for acute appendicitis.  While hospitalized, patient was found to have an acute saddle pulmonary embolism with acute cor pulmonale.  Patient is currently on chronic anticoagulation therapy.  He has continued Xarelto starter pack and will transition to daily Xarelto on January 31, 2017.  Patient states that he feels well and is without complaint.  He has resume normal diet and is tolerating well.  He denies chest pains, shortness of breath, abdominal pain, dysuria, nausea, vomiting, or diarrhea. Patient has a history of type 2 diabetes mellitus.  He states that he was on medication some years ago but stopped taking medications and has not been followed by her primary provider.  He does not follow a carbohydrate modify diet or exercise routinely.  Hemoglobin A1c in the hospital was 7.6.  No medications were started by inpatient services team for type 2 diabetes mellitus.  Patient denies polyuria, polydipsia, or polyphagia. Past Medical History:  Diagnosis Date  . Diabetes mellitus without complication [facility](HCC)    Social History   Socioeconomic History  . Marital status: Single    Spouse name: Not on file  . Number of children: Not on file  . Years of education: Not on file  . Highest education level: Not on file  Social Needs  . Financial resource strain: Not on file  . Food insecurity - worry: Not on file  . Food insecurity - inability: Not on file  . Transportation needs - medical: Not on file  . Transportation needs - non-medical: Not on file  Occupational History  . Not on file  Tobacco Use  . Smoking status: Never Smoker  . Smokeless tobacco: Never Used   Substance and Sexual Activity  . Alcohol use: Yes    Frequency: Never    Comment: occ  . Drug use: No  . Sexual activity: Not on file  Other Topics Concern  . Not on file  Social History Narrative  . Not on file   Immunization History  Administered Date(s) Administered  . Influenza,inj,Quad PF,6+ Mos 12/31/2016  . Pneumococcal Polysaccharide-23 12/31/2016   Review of Systems  Eyes: Negative for photophobia, redness and visual disturbance.  Respiratory: Negative.   Cardiovascular: Negative.   Gastrointestinal: Negative.   Endocrine: Negative for polydipsia, polyphagia and polyuria.       Objective:   Physical Exam  Constitutional: He is oriented to person, place, and time.  Eyes: Conjunctivae and EOM are normal. Pupils are equal, round, and reactive to light.  Neck: Normal range of motion. Neck supple.  Pulmonary/Chest: Effort normal and breath sounds normal.  Abdominal: Soft. Bowel sounds are normal.  Neurological: He is alert and oriented to person, place, and time.  Skin: Skin is warm and dry.  Psychiatric: He has a normal mood and affect. His behavior is normal. Thought content normal.      BP 112/68 (BP Location: Left Arm, Patient Position: Sitting, Cuff Size: Large)   Pulse (!) 103   Temp 97.9 F (36.6 C) (Oral)   Resp 16   Ht 5\' 10"  (1.778 m)   Wt 262 lb (118.8 kg)   SpO2 100%   BMI 37.59 kg/m  Assessment & Plan:  1. Type 2 diabetes mellitus without complication, without long-term current use of insulin (HCC) Hemoglobin A1c is 7.1, will start metformin 500 mg twice daily.  Discussed carbohydrate modify diet at length.  Given patient a copy of 1800-calorie carbohydrate modify diet, expressed understanding.  Patient had lots of questions pertaining to diet. - Glucose, capillary - HgB A1c - metFORMIN (GLUCOPHAGE) 500 MG tablet; Take 1 tablet (500 mg total) by mouth 2 (two) times daily with a meal.  Dispense: 60 tablet; Refill: 5 - Lipid Panel  2. Need for  Tdap vaccination  - Tdap vaccine greater than or equal to 7yo IM  3. Elevated serum creatinine Serum creatinine 11 days ago was 1.31 and GFR greater than 60.  We will repeat serum creatinine. - Basic Metabolic Panel  4.  History of pulmonary embolism Patient will continue Xarelto as prescribed for 6 months.  Will discontinue Xarelto on July 07, 2017.  RTC: 3 months for type 2 diabetes mellitus   Nolon NationsLachina Moore Farrell Pantaleo  MSN, FNP-C Patient Care [facility] [address]  [place], [place] [address] [phone]

## 2017-01-16 NOTE — Patient Instructions (Signed)
For pulmonary embolism we will continue Xarelto.  On December 28 switched to daily Xarelto at 20 mg.  He will be on Xarelto for a total of 6 months, will discontinue on July 07, 2017. For type 2 diabetes mellitus we will start a trial of metformin 500 mg twice daily with meals.  Also recommend a carbohydrate modify diet divided over several small meals throughout the day.  Increase water intake to 6-8 glasses/day and start daily walking regimen. Carbohydrate Counting for Diabetes Mellitus, Adult Carbohydrate counting is a method for keeping track of how many carbohydrates you eat. Eating carbohydrates naturally increases the amount of sugar (glucose) in the blood. Counting how many carbohydrates you eat helps keep your blood glucose within normal limits, which helps you manage your diabetes (diabetes mellitus). It is important to know how many carbohydrates you can safely have in each meal. This is different for every person. A diet and nutrition specialist (registered dietitian) can help you make a meal plan and calculate how many carbohydrates you should have at each meal and snack. Carbohydrates are found in the following foods:  Grains, such as breads and cereals.  Dried beans and soy products.  Starchy vegetables, such as potatoes, peas, and corn.  Fruit and fruit juices.  Milk and yogurt.  Sweets and snack foods, such as cake, cookies, candy, chips, and soft drinks.  How do I count carbohydrates? There are two ways to count carbohydrates in food. You can use either of the methods or a combination of both. Reading "Nutrition Facts" on packaged food The "Nutrition Facts" list is included on the labels of almost all packaged foods and beverages in the [place] It includes:  The serving size.  Information about nutrients in each serving, including the grams (g) of carbohydrate per serving.  To use the "Nutrition Facts":  Decide how many servings you will have.  Multiply the number of  servings by the number of carbohydrates per serving.  The resulting number is the total amount of carbohydrates that you will be having.  Learning standard serving sizes of other foods When you eat foods containing carbohydrates that are not packaged or do not include "Nutrition Facts" on the label, you need to measure the servings in order to count the amount of carbohydrates:  Measure the foods that you will eat with a food scale or measuring cup, if needed.  Decide how many standard-size servings you will eat.  Multiply the number of servings by 15. Most carbohydrate-rich foods have about 15 g of carbohydrates per serving. ? For example, if you eat 8 oz (170 g) of strawberries, you will have eaten 2 servings and 30 g of carbohydrates (2 servings x 15 g = 30 g).  For foods that have more than one food mixed, such as soups and casseroles, you must count the carbohydrates in each food that is included.  The following list contains standard serving sizes of common carbohydrate-rich foods. Each of these servings has about 15 g of carbohydrates:   hamburger bun or  English muffin.   oz (15 mL) syrup.   oz (14 g) jelly.  1 slice of bread.  1 six-inch tortilla.  3 oz (85 g) cooked rice or pasta.  4 oz (113 g) cooked dried beans.  4 oz (113 g) starchy vegetable, such as peas, corn, or potatoes.  4 oz (113 g) hot cereal.  4 oz (113 g) mashed potatoes or  of a large baked potato.  4 oz (113  g) canned or frozen fruit.  4 oz (120 mL) fruit juice.  4-6 crackers.  6 chicken nuggets.  6 oz (170 g) unsweetened dry cereal.  6 oz (170 g) plain fat-free yogurt or yogurt sweetened with artificial sweeteners.  8 oz (240 mL) milk.  8 oz (170 g) fresh fruit or one small piece of fruit.  24 oz (680 g) popped popcorn.  Example of carbohydrate counting Sample meal  3 oz (85 g) chicken breast.  6 oz (170 g) brown rice.  4 oz (113 g) corn.  8 oz (240 mL) milk.  8 oz (170  g) strawberries with sugar-free whipped topping. Carbohydrate calculation 1. Identify the foods that contain carbohydrates: ? Rice. ? Corn. ? Milk. ? Strawberries. 2. Calculate how many servings you have of each food: ? 2 servings rice. ? 1 serving corn. ? 1 serving milk. ? 1 serving strawberries. 3. Multiply each number of servings by 15 g: ? 2 servings rice x 15 g = 30 g. ? 1 serving corn x 15 g = 15 g. ? 1 serving milk x 15 g = 15 g. ? 1 serving strawberries x 15 g = 15 g. 4. Add together all of the amounts to find the total grams of carbohydrates eaten: ? 30 g + 15 g + 15 g + 15 g = 75 g of carbohydrates total. This information is not intended to replace advice given to you by your health care provider. Make sure you discuss any questions you have with your health care provider. Document Released: 01/21/2005 Document Revised: 08/11/2015 Document Reviewed: 07/05/2015 Elsevier Interactive Patient Education  [employer]2018 Elsevier Inc.

## 2017-01-17 ENCOUNTER — Ambulatory Visit: Payer: Self-pay | Attending: Family Medicine

## 2017-01-17 LAB — BASIC METABOLIC PANEL
BUN / CREAT RATIO: 14 (ref 10–24)
BUN: 18 mg/dL (ref 8–27)
CHLORIDE: 100 mmol/L (ref 96–106)
CO2: 23 mmol/L (ref 20–29)
Calcium: 9.3 mg/dL (ref 8.6–10.2)
Creatinine, Ser: 1.31 mg/dL — ABNORMAL HIGH (ref 0.76–1.27)
GFR calc non Af Amer: 58 mL/min/{1.73_m2} — ABNORMAL LOW (ref >59)
GFR, EST AFRICAN AMERICAN: 67 mL/min/{1.73_m2} (ref >59)
GLUCOSE: 120 mg/dL — AB (ref 65–99)
POTASSIUM: 4.8 mmol/L (ref 3.5–5.2)
SODIUM: 135 mmol/L (ref 134–144)

## 2017-01-17 LAB — LIPID PANEL
CHOL/HDL RATIO: 5.3 ratio — AB (ref 0.0–5.0)
Cholesterol, Total: 132 mg/dL (ref 100–199)
HDL: 25 mg/dL — AB (ref >39)
LDL CALC: 87 mg/dL (ref 0–99)
TRIGLYCERIDES: 102 mg/dL (ref 0–149)
VLDL Cholesterol Cal: 20 mg/dL (ref 5–40)

## 2017-01-17 NOTE — Addendum Note (Signed)
Addended by: Loney HeringBATTEN, LAURA E on: 01/17/2017 10:38 AM   Modules accepted: Orders

## 2017-01-18 LAB — MICROSCOPIC EXAMINATION

## 2017-01-18 LAB — URINALYSIS, ROUTINE W REFLEX MICROSCOPIC
Bilirubin, UA: NEGATIVE
GLUCOSE, UA: NEGATIVE
Ketones, UA: NEGATIVE
Leukocytes, UA: NEGATIVE
NITRITE UA: POSITIVE — AB
PH UA: 5 (ref 5.0–7.5)
RBC, UA: NEGATIVE
Specific Gravity, UA: 1.022 (ref 1.005–1.030)
UUROB: 0.2 mg/dL (ref 0.2–1.0)

## 2017-01-21 ENCOUNTER — Telehealth: Payer: Self-pay

## 2017-01-21 ENCOUNTER — Other Ambulatory Visit: Payer: Self-pay | Admitting: Family Medicine

## 2017-01-21 DIAGNOSIS — N390 Urinary tract infection, site not specified: Secondary | ICD-10-CM

## 2017-01-21 MED ORDER — CIPROFLOXACIN HCL 250 MG PO TABS: 250.0000 mg | ORAL_TABLET | Freq: Two times a day (BID) | ORAL | 0 refills | Status: AC

## 2017-01-21 MED FILL — CIPROFLOXACIN HCL 250 MG TA: 250 | 7 days supply | Qty: 14 | Fill #0

## 2017-01-21 NOTE — Telephone Encounter (Signed)
-----   Message from Tyler Franklin, [place] sent at 01/21/2017  8:17 AM EST ----- Regarding: lab results Please inform [name] that his urine was positive for nitrites, which typically indicates that a UTI is present. Will treat empirically with ciprofloxacin. Please have patient return to office to submit a urine sample prior to starting antibiotic and send for urine culture.   Please call [facility] to change ciprofloxacin to 7 days instead of 10.   Will follow up by phone with any abnormal laboratory results.    Thanks

## 2017-01-21 NOTE — Telephone Encounter (Signed)
I have called and updated Rx to [facility] day supply with [facility].   Called and spoke with patient, advised to come in for a new sample so we can send in for culture. Patient verbalized understanding that he needs to provide sample before starting cipro 250mg  twice daily for 7 days for possible uti. Patient will come in tomorrow 01/22/2017 to provide urine sample.  Thanks!

## 2017-01-21 NOTE — Progress Notes (Signed)
Meds ordered this encounter  Medications  . ciprofloxacin (CIPRO) 250 MG tablet    Sig: Take 1 tablet (250 mg total) by mouth 2 (two) times daily for 10 days.    Dispense:  20 tablet    Refill:  0     Nolon NationsLachina Moore Shavaughn Seidl  MSN, FNP-C [facility] [address]  [place], [place] [address] [phone]

## 2017-01-22 ENCOUNTER — Other Ambulatory Visit: Payer: Self-pay

## 2017-01-22 DIAGNOSIS — R8290 Unspecified abnormal findings in urine: Secondary | ICD-10-CM

## 2017-01-24 ENCOUNTER — Other Ambulatory Visit: Payer: Self-pay | Admitting: *Deleted

## 2017-01-24 LAB — URINE CULTURE: ORGANISM ID, BACTERIA: NO GROWTH

## 2017-01-24 MED ORDER — RIVAROXABAN 20 MG PO TABS: 20.0000 mg | ORAL_TABLET | Freq: Every day | ORAL | 3 refills | Status: DC

## 2017-01-24 NOTE — Telephone Encounter (Signed)
PRINTED FOR PASS PROGRAM 

## 2017-01-30 MED FILL — XARELTO 20 MG TABLET: 20 | 30 days supply | Qty: 30 | Fill #0

## 2017-02-14 MED FILL — ?METFORMIN HCL 500MG TABLET: 500 | 30 days supply | Qty: 60 | Fill #1

## 2017-02-26 MED FILL — XARELTO 20 MG TABLET: 20 | 30 days supply | Qty: 30 | Fill #0

## 2017-03-05 ENCOUNTER — Ambulatory Visit: Payer: Self-pay | Attending: Internal Medicine

## 2017-03-19 ENCOUNTER — Ambulatory Visit: Payer: Self-pay | Admitting: Family Medicine

## 2017-03-25 MED FILL — XARELTO 20 MG TABLET: 20 | 30 days supply | Qty: 30 | Fill #1

## 2017-03-25 MED FILL — ?METFORMIN HCL 500MG TABLET: 500 | 30 days supply | Qty: 60 | Fill #2

## 2017-03-28 ENCOUNTER — Encounter: Payer: Self-pay | Admitting: Family Medicine

## 2017-03-28 ENCOUNTER — Ambulatory Visit (INDEPENDENT_AMBULATORY_CARE_PROVIDER_SITE_OTHER): Payer: Self-pay | Admitting: Family Medicine

## 2017-03-28 VITALS — BP 128/70 | HR 80 | Temp 98.7°F | Resp 16 | Ht 70.0 in | Wt 255.0 lb

## 2017-03-28 DIAGNOSIS — E119 Type 2 diabetes mellitus without complications: Secondary | ICD-10-CM

## 2017-03-28 DIAGNOSIS — Z86711 Personal history of pulmonary embolism: Secondary | ICD-10-CM

## 2017-03-28 DIAGNOSIS — Z1159 Encounter for screening for other viral diseases: Secondary | ICD-10-CM

## 2017-03-28 LAB — POCT URINALYSIS DIP (DEVICE)
BILIRUBIN URINE: NEGATIVE
Glucose, UA: NEGATIVE mg/dL
HGB URINE DIPSTICK: NEGATIVE
KETONES UR: NEGATIVE mg/dL
Leukocytes, UA: NEGATIVE
NITRITE: NEGATIVE
PH: 6 (ref 5.0–8.0)
Protein, ur: NEGATIVE mg/dL
Specific Gravity, Urine: 1.015 (ref 1.005–1.030)
Urobilinogen, UA: 0.2 mg/dL (ref 0.0–1.0)

## 2017-03-28 LAB — POCT GLYCOSYLATED HEMOGLOBIN (HGB A1C): Hemoglobin A1C: 5.5

## 2017-03-28 NOTE — Patient Instructions (Addendum)
Your hemoglobin A1c is decreased to 5.5 from 7.1.  Will decrease metformin to 500 mg daily.  Continue a low-fat, low carbohydrate diet divided over 5-6 small meals throughout the day.  Also increase water intake to 6-8 glasses.   We will follow-up around June 3 to recheck your creatinine level and will discontinue Xarelto at that time    Diabetes Mellitus and Exercise Exercising regularly is important for your overall health, especially when you have diabetes (diabetes mellitus). Exercising is not only about losing weight. It has many health benefits, such as increasing muscle strength and bone density and reducing body fat and stress. This leads to improved fitness, flexibility, and endurance, all of which result in better overall health. Exercise has additional benefits for people with diabetes, including:  Reducing appetite.  Helping to lower and control blood glucose.  Lowering blood pressure.  Helping to control amounts of fatty substances (lipids) in the blood, such as cholesterol and triglycerides.  Helping the body to respond better to insulin (improving insulin sensitivity).  Reducing how much insulin the body needs.  Decreasing the risk for heart disease by: ? Lowering cholesterol and triglyceride levels. ? Increasing the levels of good cholesterol. ? Lowering blood glucose levels.  What is my activity plan? Your health care provider or certified diabetes educator can help you make a plan for the type and frequency of exercise (activity plan) that works for you. Make sure that you:  Do at least 150 minutes of moderate-intensity or vigorous-intensity exercise each week. This could be brisk walking, biking, or water aerobics. ? Do stretching and strength exercises, such as yoga or weightlifting, at least 2 times a week. ? Spread out your activity over at least 3 days of the week.  Get some form of physical activity every day. ? Do not go more than 2 days in a row without  some kind of physical activity. ? Avoid being inactive for more than 90 minutes at a time. Take frequent breaks to walk or stretch.  Choose a type of exercise or activity that you enjoy, and set realistic goals.  Start slowly, and gradually increase the intensity of your exercise over time.  What do I need to know about managing my diabetes?  Check your blood glucose before and after exercising. ? If your blood glucose is higher than 240 mg/dL (40.9 mmol/L) before you exercise, check your urine for ketones. If you have ketones in your urine, do not exercise until your blood glucose returns to normal.  Know the symptoms of low blood glucose (hypoglycemia) and how to treat it. Your risk for hypoglycemia increases during and after exercise. Common symptoms of hypoglycemia can include: ? Hunger. ? Anxiety. ? Sweating and feeling clammy. ? Confusion. ? Dizziness or feeling light-headed. ? Increased heart rate or palpitations. ? Blurry vision. ? Tingling or numbness around the mouth, lips, or tongue. ? Tremors or shakes. ? Irritability.  Keep a rapid-acting carbohydrate snack available before, during, and after exercise to help prevent or treat hypoglycemia.  Avoid injecting insulin into areas of the body that are going to be exercised. For example, avoid injecting insulin into: ? The arms, when playing tennis. ? The legs, when jogging.  Keep records of your exercise habits. Doing this can help you and your health care provider adjust your diabetes management plan as needed. Write down: ? Food that you eat before and after you exercise. ? Blood glucose levels before and after you exercise. ? The type and  amount of exercise you have done. ? When your insulin is expected to peak, if you use insulin. Avoid exercising at times when your insulin is peaking.  When you start a new exercise or activity, work with your health care provider to make sure the activity is safe for you, and to adjust  your insulin, medicines, or food intake as needed.  Drink plenty of water while you exercise to prevent dehydration or heat stroke. Drink enough fluid to keep your urine clear or pale yellow. This information is not intended to replace advice given to you by your health care provider. Make sure you discuss any questions you have with your health care provider. Document Released: 04/13/2003 Document Revised: 08/11/2015 Document Reviewed: 07/03/2015 Elsevier Interactive Patient Education  2018 [employer]

## 2017-03-28 NOTE — Progress Notes (Signed)
Chief Complaint  Patient presents with  . Diabetes    Subjective:    Patient ID: Tyler Franklin, male    DOB: 03/16/1955, 62 y.o.   MRN: [id number]  HPI  Tyler Franklin, a 62 year old male presents accompanied by daughter for a 2 month follow up of pulmonary embolism and DMII.    Patient is currently on chronic anticoagulation therapy. He has been taking Xarelto, 20 mg consistently.   Patient states that he feels well and is without complaint.    He denies chest pains, shortness of breath, abdominal pain, dysuria, nausea, vomiting, or diarrhea. Patient has a history of type 2 diabetes mellitus.  Patient was started on Metformin 500 mg twice daily. His daughter prepares most meals and he has been following a carbohydrate modified diet. His most recent hemoglobin a1C was 7.1. Patient denies polyuria, polydipsia, or polyphagia. Past Medical History:  Diagnosis Date  . Diabetes mellitus without complication [facility](HCC)    Social History   Socioeconomic History  . Marital status: Single    Spouse name: Not on file  . Number of children: Not on file  . Years of education: Not on file  . Highest education level: Not on file  Social Needs  . Financial resource strain: Not on file  . Food insecurity - worry: Not on file  . Food insecurity - inability: Not on file  . Transportation needs - medical: Not on file  . Transportation needs - non-medical: Not on file  Occupational History  . Not on file  Tobacco Use  . Smoking status: Never Smoker  . Smokeless tobacco: Never Used  Substance and Sexual Activity  . Alcohol use: Yes    Frequency: Never    Comment: occ  . Drug use: No  . Sexual activity: Not on file  Other Topics Concern  . Not on file  Social History Narrative  . Not on file   Immunization History  Administered Date(s) Administered  . Influenza,inj,Quad PF,6+ Mos 12/31/2016  . Pneumococcal Polysaccharide-23 12/31/2016  . Tdap 01/16/2017   Review of Systems  Eyes: Negative  for photophobia, redness and visual disturbance.  Respiratory: Negative.   Cardiovascular: Negative.   Gastrointestinal: Negative.   Endocrine: Negative for polydipsia, polyphagia and polyuria.  Genitourinary: Negative.   Musculoskeletal: Negative.   Allergic/Immunologic: Negative.   Neurological: Negative.   Hematological: Negative.   Psychiatric/Behavioral: Negative.        Objective:   Physical Exam  Constitutional: He is oriented to person, place, and time.  Eyes: Conjunctivae and EOM are normal. Pupils are equal, round, and reactive to light.  Neck: Normal range of motion. Neck supple.  Pulmonary/Chest: Effort normal and breath sounds normal.  Abdominal: Soft. Bowel sounds are normal.  Neurological: He is alert and oriented to person, place, and time.  Skin: Skin is warm and dry.  Psychiatric: He has a normal mood and affect. His behavior is normal. Thought content normal.      BP 128/70 (BP Location: Left Arm, Patient Position: Sitting, Cuff Size: Large)   Pulse 80   Temp 98.7 F (37.1 C) (Oral)   Resp 16   Ht 5\' 10"  (1.778 m)   Wt 255 lb (115.7 kg)   SpO2 100%   BMI 36.59 kg/m  Assessment & Plan:   1. Type 2 diabetes mellitus without complication, without long-term current use of insulin (HCC) Hemoglobin a1c has decreased from 7.1 to 5.5. Will decrease Metformin to 500 mg with breakfast.  -  HgB A1c - Microalbumin/Creatinine Ratio, Urine - Basic Metabolic Panel  2. History of pulmonary embolism Patient will continue Xarelto until 07/07/2017.  Will review creatinine level today  3. Need for hepatitis C screening test - Hepatitis C Antibody    RTC: Follow up around 07/07/2017 for PE and DMII   Nolon Nations  MSN, FNP-C Patient Care [facility] [address] [place], [place] [address] [phone]

## 2017-03-29 LAB — BASIC METABOLIC PANEL
BUN / CREAT RATIO: 15 (ref 10–24)
BUN: 19 mg/dL (ref 8–27)
CALCIUM: 9.8 mg/dL (ref 8.6–10.2)
CHLORIDE: 102 mmol/L (ref 96–106)
CO2: 24 mmol/L (ref 20–29)
Creatinine, Ser: 1.28 mg/dL — ABNORMAL HIGH (ref 0.76–1.27)
GFR, EST AFRICAN AMERICAN: 69 mL/min/{1.73_m2} (ref >59)
GFR, EST NON AFRICAN AMERICAN: 60 mL/min/{1.73_m2} (ref >59)
Glucose: 81 mg/dL (ref 65–99)
POTASSIUM: 4.2 mmol/L (ref 3.5–5.2)
Sodium: 141 mmol/L (ref 134–144)

## 2017-03-29 LAB — HEPATITIS C ANTIBODY: HEP C VIRUS AB: 0.1 {s_co_ratio} (ref 0.0–0.9)

## 2017-03-29 LAB — MICROALBUMIN / CREATININE URINE RATIO
CREATININE, UR: 87.7 mg/dL
MICROALB/CREAT RATIO: 5.7 mg/g{creat} (ref 0.0–30.0)
Microalbumin, Urine: 5 ug/mL

## 2017-04-03 ENCOUNTER — Telehealth: Payer: Self-pay | Admitting: Family Medicine

## 2017-04-03 NOTE — Telephone Encounter (Signed)
I left the patient a message that he can now apply for the [facility] Financial assistance program because his account [id number] has been dropped to self pay.  He will need to either make an appointment with our office or send his application and documents directly to the hospital.

## 2017-04-03 NOTE — Telephone Encounter (Signed)
Pt's sister called to request information on the status of pt's CAFA/OC. Please follow up

## 2017-04-18 MED FILL — XARELTO 20 MG TABLET: 20 | 30 days supply | Qty: 30 | Fill #2

## 2017-04-18 MED FILL — metFORMIN HCL 500 MG TABS: 500 | 30 days supply | Qty: 60 | Fill #3

## 2017-05-13 NOTE — Telephone Encounter (Signed)
I called this patient and left him a message to either call our office and schedule an appointment to apply for CAFA or come to one of the walk in days to apply for CAFA.  Pt was Medicaid Potential when he applied in January and was told to wait until the Medicaid process was completed.  It is now complete and patient was advised to apply for CAFA either directly with the hospital or make an appt with our office for assistance.

## 2017-05-26 MED FILL — ?METFORMIN HCL 500MG TABLET: 500 | 30 days supply | Qty: 60 | Fill #4

## 2017-05-26 MED FILL — XARELTO 20 MG TABLET: 20 | 30 days supply | Qty: 30 | Fill #3

## 2017-06-23 MED FILL — XARELTO 20 MG TABLET: 20 | 30 days supply | Qty: 30 | Fill #4

## 2017-06-23 MED FILL — ?METFORMIN HCL 500MG TABLET: 500 | 30 days supply | Qty: 60 | Fill #5

## 2017-06-26 ENCOUNTER — Ambulatory Visit: Payer: Self-pay | Admitting: Family Medicine

## 2017-07-16 ENCOUNTER — Ambulatory Visit (INDEPENDENT_AMBULATORY_CARE_PROVIDER_SITE_OTHER): Payer: Self-pay | Admitting: Family Medicine

## 2017-07-16 ENCOUNTER — Encounter: Payer: Self-pay | Admitting: Family Medicine

## 2017-07-16 VITALS — BP 123/63 | HR 77 | Temp 97.9°F | Resp 16 | Ht 70.0 in | Wt 259.0 lb

## 2017-07-16 DIAGNOSIS — Z86711 Personal history of pulmonary embolism: Secondary | ICD-10-CM

## 2017-07-16 DIAGNOSIS — E119 Type 2 diabetes mellitus without complications: Secondary | ICD-10-CM

## 2017-07-16 LAB — POCT GLYCOSYLATED HEMOGLOBIN (HGB A1C): HEMOGLOBIN A1C: 5.6 % (ref 4.0–5.6)

## 2017-07-16 MED ORDER — METFORMIN HCL 500 MG PO TABS: 500.0000 mg | ORAL_TABLET | Freq: Every day | ORAL | 5 refills | Status: DC

## 2017-07-16 NOTE — Patient Instructions (Addendum)
Your hemoglobin A1c is 5.6, which is at goal.  We will continue metformin at 500 mg daily with breakfast.  Continue carbohydrate modify diet divided over small meals throughout the day.  Also increase daily activity level.  I recommend exercising about 150 minutes/day which is equivalent to 30 minutes/day.  Increase water intake to about 64 ounces per day.  Continue multivitamin.  You have been on 6 months of chronic anticoagulation therapy due to history of DVT.  Please discontinue medication at this point.  We will follow-up by phone with any abnormal laboratory values   Diabetes and Foot Care Diabetes may cause you to have problems because of poor blood supply (circulation) to your feet and legs. This may cause the skin on your feet to become thinner, break easier, and heal more slowly. Your skin may become dry, and the skin may peel and crack. You may also have nerve damage in your legs and feet causing decreased feeling in them. You may not notice minor injuries to your feet that could lead to infections or more serious problems. Taking care of your feet is one of the most important things you can do for yourself. Follow these instructions at home:  Wear shoes at all times, even in the house. Do not go barefoot. Bare feet are easily injured.  Check your feet daily for blisters, cuts, and redness. If you cannot see the bottom of your feet, use a mirror or ask someone for help.  Wash your feet with warm water (do not use hot water) and mild soap. Then pat your feet and the areas between your toes until they are completely dry. Do not soak your feet as this can dry your skin.  Apply a moisturizing lotion or petroleum jelly (that does not contain alcohol and is unscented) to the skin on your feet and to dry, brittle toenails. Do not apply lotion between your toes.  Trim your toenails straight across. Do not dig under them or around the cuticle. File the edges of your nails with an emery board or  nail file.  Do not cut corns or calluses or try to remove them with medicine.  Wear clean socks or stockings every day. Make sure they are not too tight. Do not wear knee-high stockings since they may decrease blood flow to your legs.  Wear shoes that fit properly and have enough cushioning. To break in new shoes, wear them for just a few hours a day. This prevents you from injuring your feet. Always look in your shoes before you put them on to be sure there are no objects inside.  Do not cross your legs. This may decrease the blood flow to your feet.  If you find a minor scrape, cut, or break in the skin on your feet, keep it and the skin around it clean and dry. These areas may be cleansed with mild soap and water. Do not cleanse the area with peroxide, alcohol, or iodine.  When you remove an adhesive bandage, be sure not to damage the skin around it.  If you have a wound, look at it several times a day to make sure it is healing.  Do not use heating pads or hot water bottles. They may burn your skin. If you have lost feeling in your feet or legs, you may not know it is happening until it is too late.  Make sure your health care provider performs a complete foot exam at least annually or more often  if you have foot problems. Report any cuts, sores, or bruises to your health care provider immediately. Contact a health care provider if:  You have an injury that is not healing.  You have cuts or breaks in the skin.  You have an ingrown nail.  You notice redness on your legs or feet.  You feel burning or tingling in your legs or feet.  You have pain or cramps in your legs and feet.  Your legs or feet are numb.  Your feet always feel cold. Get help right away if:  There is increasing redness, swelling, or pain in or around a wound.  There is a red line that goes up your leg.  Pus is coming from a wound.  You develop a fever or as directed by your health care provider.  You  notice a bad smell coming from an ulcer or wound. This information is not intended to replace advice given to you by your health care provider. Make sure you discuss any questions you have with your health care provider. Document Released: 01/19/2000 Document Revised: 06/29/2015 Document Reviewed: 06/30/2012 Elsevier Interactive Patient Education  2017 [employer]

## 2017-07-16 NOTE — Progress Notes (Signed)
Chief Complaint  Patient presents with  . Diabetes    Subjective:    Patient ID: Tyler Franklin, male    DOB: [birth year], 62 y.o.   MRN: [id number]  HPI  Tyler Franklin, a 62 year old male presents accompanied by daughter for a 6 month follow up of pulmonary embolism and DMII.    Patient is currently on chronic anticoagulation therapy for history of PE. He has been taking Xarelto, 20 mg consistently.    Patient states that he feels well and is without complaint.    He denies chest pains, shortness of breath, abdominal pain, dysuria, nausea, vomiting, or diarrhea. Patient has a history of type 2 diabetes mellitus.  Patient was continued on Metformin 500 mg daily. His daughter prepares most meals and he has been following a carbohydrate modified diet. His most recent hemoglobin a1C was 5.5. Patient denies polyuria, polydipsia, or polyphagia. Past Medical History:  Diagnosis Date  . Diabetes mellitus without complication [facility])    Social History   Socioeconomic History  . Marital status: Single    Spouse name: Not on file  . Number of children: Not on file  . Years of education: Not on file  . Highest education level: Not on file  Occupational History  . Not on file  Social Needs  . Financial resource strain: Not on file  . Food insecurity:    Worry: Not on file    Inability: Not on file  . Transportation needs:    Medical: Not on file    Non-medical: Not on file  Tobacco Use  . Smoking status: Never Smoker  . Smokeless tobacco: Never Used  Substance and Sexual Activity  . Alcohol use: Yes    Frequency: Never    Comment: occ  . Drug use: No  . Sexual activity: Not on file  Lifestyle  . Physical activity:    Days per week: Not on file    Minutes per session: Not on file  . Stress: Not on file  Relationships  . Social connections:    Talks on phone: Not on file    Gets together: Not on file    Attends religious service: Not on file    Active member of club or  organization: Not on file    Attends meetings of clubs or organizations: Not on file    Relationship status: Not on file  . Intimate partner violence:    Fear of current or ex partner: Not on file    Emotionally abused: Not on file    Physically abused: Not on file    Forced sexual activity: Not on file  Other Topics Concern  . Not on file  Social History Narrative  . Not on file   Immunization History  Administered Date(s) Administered  . Influenza,inj,Quad PF,6+ Mos 12/31/2016  . Pneumococcal Polysaccharide-23 12/31/2016  . Tdap 01/16/2017   Review of Systems  Eyes: Negative for photophobia, redness and visual disturbance.  Respiratory: Negative.   Cardiovascular: Negative.   Gastrointestinal: Negative.   Endocrine: Negative for polydipsia, polyphagia and polyuria.  Genitourinary: Negative.   Musculoskeletal: Negative.   Allergic/Immunologic: Negative.   Neurological: Negative.   Hematological: Negative.   Psychiatric/Behavioral: Negative.        Objective:   Physical Exam  Constitutional: He is oriented to person, place, and time.  Eyes: Pupils are equal, round, and reactive to light. Conjunctivae and EOM are normal.  Neck: Normal range of motion. Neck supple.  Pulmonary/Chest: Effort normal  and breath sounds normal.  Abdominal: Soft. Bowel sounds are normal.  Neurological: He is alert and oriented to person, place, and time.  Skin: Skin is warm and dry.  Psychiatric: He has a normal mood and affect. His behavior is normal. Thought content normal.      BP 123/63 (BP Location: Left Arm, Patient Position: Sitting, Cuff Size: Large)   Pulse 77   Temp 97.9 F (36.6 C) (Oral)   Resp 16   Ht 5\' 10"  (1.778 m)   Wt 259 lb (117.5 kg)   SpO2 100%   BMI 37.16 kg/m  Assessment & Plan:  1. Type 2 diabetes mellitus without complication, without long-term current use of insulin (HCC) Hemoglobin A1c is at goal, will continue metformin 500 mg daily with breakfast.   Recommend the patient continues carbohydrate modify diet divided over small meals throughout the day. We will recheck hemoglobin A1c in 6 months - HgB A1c - Urinalysis Dipstick - metFORMIN (GLUCOPHAGE) 500 MG tablet; Take 1 tablet (500 mg total) by mouth daily with breakfast.  Dispense: 30 tablet; Refill: 5 - Basic Metabolic Panel  2. History of pulmonary embolism  Will discontinue Xarelto, patient underwent  6 months of treatment for an unprovoked PE   RTC: 6 months for DMII   Nolon NationsLachina Moore Hollis  MSN, FNP-C Patient Care [facility] [address]  [place], [place] [id number] [phone]

## 2017-07-17 ENCOUNTER — Telehealth: Payer: Self-pay

## 2017-07-17 LAB — BASIC METABOLIC PANEL
BUN / CREAT RATIO: 9 — AB (ref 10–24)
BUN: 11 mg/dL (ref 8–27)
CHLORIDE: 104 mmol/L (ref 96–106)
CO2: 24 mmol/L (ref 20–29)
Calcium: 9.4 mg/dL (ref 8.6–10.2)
Creatinine, Ser: 1.29 mg/dL — ABNORMAL HIGH (ref 0.76–1.27)
GFR calc Af Amer: 69 mL/min/{1.73_m2} (ref >59)
GFR calc non Af Amer: 59 mL/min/{1.73_m2} — ABNORMAL LOW (ref >59)
GLUCOSE: 154 mg/dL — AB (ref 65–99)
POTASSIUM: 3.7 mmol/L (ref 3.5–5.2)
SODIUM: 141 mmol/L (ref 134–144)

## 2017-07-17 NOTE — Telephone Encounter (Signed)
Called, no answer. Left a message for patient to call back. Thanks!  

## 2017-07-17 NOTE — Telephone Encounter (Signed)
-----   Message from Massie MaroonLachina M Hollis, [place] sent at 07/17/2017  2:02 PM EDT ----- Regarding: lab results Please inform patient that creatinine level remains mildly elevated, which is consistent with chronic kidney disease.  Continue to take antidiabetic medications consistently.  Will recheck creatinine level at follow-up appointment.   Nolon NationsLachina Moore Hollis  MSN, FNP-C Patient Care [facility] [address]  [place], [place] [id number] [phone]

## 2017-07-17 NOTE — Telephone Encounter (Signed)
Called and spoke with patient. Advised that creatinine level remains mildly elevated. And that it is important to take antidiabetic medications consistently. Advised that we will recheck at next follow up. Thanks!

## 2017-09-01 MED FILL — ?METFORMIN HCL 500MG TABS: 500 | 30 days supply | Qty: 30 | Fill #0

## 2017-11-28 MED FILL — metFORMIN HCL 500 MG TABS: 500 | 30 days supply | Qty: 30 | Fill #1

## 2017-12-31 MED FILL — metFORMIN HCL 500 MG TABS: 500 | 30 days supply | Qty: 30 | Fill #2

## 2018-01-15 ENCOUNTER — Ambulatory Visit (INDEPENDENT_AMBULATORY_CARE_PROVIDER_SITE_OTHER): Payer: Self-pay | Admitting: Family Medicine

## 2018-01-15 ENCOUNTER — Encounter: Payer: Self-pay | Admitting: Family Medicine

## 2018-01-15 VITALS — BP 110/74 | HR 86 | Temp 99.1°F | Resp 16 | Ht 70.0 in | Wt 253.0 lb

## 2018-01-15 DIAGNOSIS — E119 Type 2 diabetes mellitus without complications: Secondary | ICD-10-CM

## 2018-01-15 LAB — POCT URINALYSIS DIPSTICK
Bilirubin, UA: NEGATIVE
Blood, UA: NEGATIVE
Glucose, UA: NEGATIVE
Ketones, UA: NEGATIVE
Leukocytes, UA: NEGATIVE
Nitrite, UA: NEGATIVE
Protein, UA: NEGATIVE
Spec Grav, UA: 1.015 (ref 1.010–1.025)
Urobilinogen, UA: 0.2 E.U./dL
pH, UA: 5.5 (ref 5.0–8.0)

## 2018-01-15 LAB — POCT GLYCOSYLATED HEMOGLOBIN (HGB A1C): Hemoglobin A1C: 5.6 % (ref 4.0–5.6)

## 2018-01-15 MED ORDER — METFORMIN HCL 500 MG PO TABS: 500.0000 mg | ORAL_TABLET | Freq: Every day | ORAL | 5 refills | Status: DC

## 2018-01-15 NOTE — Patient Instructions (Addendum)
You are doing a great job! Keep up the good work.   Diabetes and Foot Care Diabetes may cause you to have problems because of poor blood supply (circulation) to your feet and legs. This may cause the skin on your feet to become thinner, break easier, and heal more slowly. Your skin may become dry, and the skin may peel and crack. You may also have nerve damage in your legs and feet causing decreased feeling in them. You may not notice minor injuries to your feet that could lead to infections or more serious problems. Taking care of your feet is one of the most important things you can do for yourself. Follow these instructions at home:  Wear shoes at all times, even in the house. Do not go barefoot. Bare feet are easily injured.  Check your feet daily for blisters, cuts, and redness. If you cannot see the bottom of your feet, use a mirror or ask someone for help.  Wash your feet with warm water (do not use hot water) and mild soap. Then pat your feet and the areas between your toes until they are completely dry. Do not soak your feet as this can dry your skin.  Apply a moisturizing lotion or petroleum jelly (that does not contain alcohol and is unscented) to the skin on your feet and to dry, brittle toenails. Do not apply lotion between your toes.  Trim your toenails straight across. Do not dig under them or around the cuticle. File the edges of your nails with an emery board or nail file.  Do not cut corns or calluses or try to remove them with medicine.  Wear clean socks or stockings every day. Make sure they are not too tight. Do not wear knee-high stockings since they may decrease blood flow to your legs.  Wear shoes that fit properly and have enough cushioning. To break in new shoes, wear them for just a few hours a day. This prevents you from injuring your feet. Always look in your shoes before you put them on to be sure there are no objects inside.  Do not cross your legs. This may  decrease the blood flow to your feet.  If you find a minor scrape, cut, or break in the skin on your feet, keep it and the skin around it clean and dry. These areas may be cleansed with mild soap and water. Do not cleanse the area with peroxide, alcohol, or iodine.  When you remove an adhesive bandage, be sure not to damage the skin around it.  If you have a wound, look at it several times a day to make sure it is healing.  Do not use heating pads or hot water bottles. They may burn your skin. If you have lost feeling in your feet or legs, you may not know it is happening until it is too late.  Make sure your health care provider performs a complete foot exam at least annually or more often if you have foot problems. Report any cuts, sores, or bruises to your health care provider immediately. Contact a health care provider if:  You have an injury that is not healing.  You have cuts or breaks in the skin.  You have an ingrown nail.  You notice redness on your legs or feet.  You feel burning or tingling in your legs or feet.  You have pain or cramps in your legs and feet.  Your legs or feet are numb.  Your  feet always feel cold. Get help right away if:  There is increasing redness, swelling, or pain in or around a wound.  There is a red line that goes up your leg.  Pus is coming from a wound.  You develop a fever or as directed by your health care provider.  You notice a bad smell coming from an ulcer or wound. This information is not intended to replace advice given to you by your health care provider. Make sure you discuss any questions you have with your health care provider. Document Released: 01/19/2000 Document Revised: 06/29/2015 Document Reviewed: 06/30/2012 Elsevier Interactive Patient Education  2017 [employer].

## 2018-01-15 NOTE — Progress Notes (Signed)
  [facility]   Progress Note: General Provider: Mike GipAndre Fabien Travelstead, FNP  SUBJECTIVE:   Tyler OsierMichael Franklin is a 62 y.o. male who  has a past medical history of Diabetes mellitus without complication (HCC).. Patient presents today for Diabetes Patient doing well without complaints today. Patient states that he is using an exercise bike and eating a healthy diet.  Review of Systems  Constitutional: Negative.   HENT: Negative.   Eyes: Negative.   Respiratory: Negative.   Cardiovascular: Negative.   Gastrointestinal: Negative.   Genitourinary: Negative.   Musculoskeletal: Negative.   Skin: Negative.   Neurological: Negative.   Psychiatric/Behavioral: Negative.      OBJECTIVE: BP 110/74 (BP Location: Left Arm, Patient Position: Sitting, Cuff Size: Large)   Pulse 86   Temp 99.1 F (37.3 C) (Oral)   Resp 16   Ht 5\' 10"  (1.778 m)   Wt 253 lb (114.8 kg)   SpO2 100%   BMI 36.30 kg/m   Wt Readings from Last 3 Encounters:  01/15/18 253 lb (114.8 kg)  07/16/17 259 lb (117.5 kg)  03/28/17 255 lb (115.7 kg)     Physical Exam Vitals signs and nursing note reviewed.  Constitutional:      General: He is not in acute distress.    Appearance: He is well-developed.  HENT:     Head: Normocephalic and atraumatic.  Eyes:     Conjunctiva/sclera: Conjunctivae normal.     Pupils: Pupils are equal, round, and reactive to light.  Neck:     Musculoskeletal: Normal range of motion.  Cardiovascular:     Rate and Rhythm: Normal rate and regular rhythm.     Heart sounds: Normal heart sounds.  Pulmonary:     Effort: Pulmonary effort is normal. No respiratory distress.     Breath sounds: Normal breath sounds.  Abdominal:     General: Bowel sounds are normal. There is no distension.     Palpations: Abdomen is soft.  Musculoskeletal: Normal range of motion.  Skin:    General: Skin is warm and dry.  Neurological:     Mental Status: He is alert and  oriented to person, place, and time.  Psychiatric:        Behavior: Behavior normal.        Thought Content: Thought content normal.     ASSESSMENT/PLAN:   1. Type 2 diabetes mellitus without complication, without long-term current use of insulin (HCC) A1c is 5.6 today.  No medication changes warranted at the present time.  We will continue to monitor - HgB A1c - Urinalysis Dipstick - metFORMIN (GLUCOPHAGE) 500 MG tablet; Take 1 tablet (500 mg total) by mouth daily with breakfast.  Dispense: 30 tablet; Refill: 5      The patient was given clear instructions to go to ER or return to medical center if symptoms do not improve, worsen or new problems develop. The patient verbalized understanding and agreed with plan of care.   Ms. Freda Jacksonndr L. Riley Lamouglas, FNP-BC [facility] [address]  [place], [place] [id number] [phone]     This note has been created with Dragon speech recognition software and smart phrase technology. Any transcriptional errors are unintentional.

## 2018-02-02 MED FILL — metFORMIN HCL 500 MG TABS: 500 | 30 days supply | Qty: 30 | Fill #3

## 2018-03-06 MED FILL — ?METFORMIN HCL 500MG TABLET: 500 | 30 days supply | Qty: 30 | Fill #4

## 2018-04-07 MED FILL — metFORMIN HCL 500 MG TABS: 500 | 30 days supply | Qty: 30 | Fill #5

## 2018-04-27 MED FILL — metFORMIN HCL 500 MG TABS: 500 | 30 days supply | Qty: 30 | Fill #0

## 2018-05-28 MED FILL — metFORMIN HCL 500 MG TABS: 500 | 30 days supply | Qty: 30 | Fill #1

## 2018-07-07 MED FILL — metFORMIN HCL 500 MG TABS: 500 | 30 days supply | Qty: 30 | Fill #2

## 2018-07-17 ENCOUNTER — Ambulatory Visit (INDEPENDENT_AMBULATORY_CARE_PROVIDER_SITE_OTHER): Payer: Self-pay | Admitting: Family Medicine

## 2018-07-17 ENCOUNTER — Encounter: Payer: Self-pay | Admitting: Family Medicine

## 2018-07-17 ENCOUNTER — Other Ambulatory Visit: Payer: Self-pay

## 2018-07-17 VITALS — BP 123/69 | HR 80 | Temp 98.7°F | Resp 14 | Ht 70.0 in | Wt 272.0 lb

## 2018-07-17 DIAGNOSIS — E119 Type 2 diabetes mellitus without complications: Secondary | ICD-10-CM

## 2018-07-17 LAB — POCT URINALYSIS DIPSTICK
Bilirubin, UA: NEGATIVE
Blood, UA: NEGATIVE
Glucose, UA: NEGATIVE
Ketones, UA: NEGATIVE
Leukocytes, UA: NEGATIVE
Nitrite, UA: NEGATIVE
Protein, UA: POSITIVE — AB
Spec Grav, UA: 1.025 (ref 1.010–1.025)
Urobilinogen, UA: 0.2 E.U./dL
pH, UA: 5.5 (ref 5.0–8.0)

## 2018-07-17 LAB — POCT GLYCOSYLATED HEMOGLOBIN (HGB A1C): Hemoglobin A1C: 6.6 % — AB (ref 4.0–5.6)

## 2018-07-17 NOTE — Patient Instructions (Signed)
Your A1C increased from 5.6 to 6.6. you are still under 7, which is good. However, we do not want it to go higher. I want you to work on losing weight. Let's shoot for 20 pounds between now and your next visit. You have 3 months. You can do it!!!  Diabetes Mellitus and Standards of Medical Care Managing diabetes (diabetes mellitus) can be complicated. Your diabetes treatment may be managed by a team of health care providers, including:  A physician who specializes in diabetes (endocrinologist).  A nurse practitioner or physician assistant.  Nurses.  A diet and nutrition specialist (registered dietitian).  A certified diabetes educator (CDE).  An exercise specialist.  A pharmacist.  An eye doctor.  A foot specialist (podiatrist).  A dentist.  A primary care provider.  A mental health provider. Your health care providers follow guidelines to help you get the best quality of care. The following schedule is a general guideline for your diabetes management plan. Your health care providers may give you more specific instructions. Physical exams Upon being diagnosed with diabetes mellitus, and each year after that, your health care provider will ask about your medical and family history. He or she will also do a physical exam. Your exam may include:  Measuring your height, weight, and body mass index (BMI).  Checking your blood pressure. This will be done at every routine medical visit. Your target blood pressure may vary depending on your medical conditions, your age, and other factors.  Thyroid gland exam.  Skin exam.  Screening for damage to your nerves (peripheral neuropathy). This may include checking the pulse in your legs and feet and checking the level of sensation in your hands and feet.  A complete foot exam to inspect the structure and skin of your feet, including checking for cuts, bruises, redness, blisters, sores, or other problems.  Screening for blood vessel  (vascular) problems, which may include checking the pulse in your legs and feet and checking your temperature. Blood tests Depending on your treatment plan and your personal needs, you may have the following tests done:  HbA1c (hemoglobin A1c). This test provides information about blood sugar (glucose) control over the previous 2-3 months. It is used to adjust your treatment plan, if needed. This test will be done: ? At least 2 times a year, if you are meeting your treatment goals. ? 4 times a year, if you are not meeting your treatment goals or if treatment goals have changed.  Lipid testing, including total, LDL, and HDL cholesterol and triglyceride levels. ? The goal for LDL is less than 100 mg/dL (5.5 mmol/L). If you are at high risk for complications, the goal is less than 70 mg/dL (3.9 mmol/L). ? The goal for HDL is 40 mg/dL (2.2 mmol/L) or higher for men and 50 mg/dL (2.8 mmol/L) or higher for women. An HDL cholesterol of 60 mg/dL (3.3 mmol/L) or higher gives some protection against heart disease. ? The goal for triglycerides is less than 150 mg/dL (8.3 mmol/L).  Liver function tests.  Kidney function tests.  Thyroid function tests. Dental and eye exams  Visit your dentist two times a year.  If you have type 1 diabetes, your health care provider may recommend an eye exam 3-5 years after you are diagnosed, and then once a year after your first exam. ? For children with type 1 diabetes, a health care provider may recommend an eye exam when your child is age 63 or older and has had diabetes  for 3-5 years. After the first exam, your child should get an eye exam once a year.  If you have type 2 diabetes, your health care provider may recommend an eye exam as soon as you are diagnosed, and then once a year after your first exam. Immunizations   The yearly flu (influenza) vaccine is recommended for everyone 6 months or older who has diabetes.  The pneumonia (pneumococcal) vaccine is  recommended for everyone 2 years or older who has diabetes. If you are 4 or older, you may get the pneumonia vaccine as a series of two separate shots.  The hepatitis B vaccine is recommended for adults shortly after being diagnosed with diabetes.  Adults and children with diabetes should receive all other vaccines according to age-specific recommendations from the Centers for Disease Control and Prevention (CDC). Mental and emotional health Screening for symptoms of eating disorders, anxiety, and depression is recommended at the time of diagnosis and afterward as needed. If your screening shows that you have symptoms (positive screening result), you may need more evaluation and you may work with a mental health care provider. Treatment plan Your treatment plan will be reviewed at every medical visit. You and your health care provider will discuss:  How you are taking your medicines, including insulin.  Any side effects you are experiencing.  Your blood glucose target goals.  The frequency of your blood glucose monitoring.  Lifestyle habits, such as activity level as well as tobacco, alcohol, and substance use. Diabetes self-management education Your health care provider will assess how well you are monitoring your blood glucose levels and whether you are taking your insulin correctly. He or she may refer you to:  A certified diabetes educator to manage your diabetes throughout your life, starting at diagnosis.  A registered dietitian who can create or review your personal nutrition plan.  An exercise specialist who can discuss your activity level and exercise plan. Summary  Managing diabetes (diabetes mellitus) can be complicated. Your diabetes treatment may be managed by a team of health care providers.  Your health care providers follow guidelines in order to help you get the best quality of care.  Standards of care including having regular physical exams, blood tests, blood  pressure monitoring, immunizations, screening tests, and education about how to manage your diabetes.  Your health care providers may also give you more specific instructions based on your individual health. This information is not intended to replace advice given to you by your health care provider. Make sure you discuss any questions you have with your health care provider. Document Released: 11/18/2008 Document Revised: 10/10/2017 Document Reviewed: 10/20/2015 Elsevier Interactive Patient Education  2019 [employer].

## 2018-07-17 NOTE — Progress Notes (Signed)
  [facility]   Progress Note: General Provider: Lanae Boast, FNP  SUBJECTIVE:   Tyler Franklin is a 63 y.o. male who  has a past medical history of Diabetes mellitus without complication ([place]).. Patient presents today for Diabetes  He reports that he is doing well. He does not check CBG at home. His last a1c was 5.6 in 01/2018. He reports compliance with medications. He has not been as vigilant with eating and exercising due to the [place] quarantine. He denies problems or concerns today.  Review of Systems  Constitutional: Negative.   HENT: Negative.   Eyes: Negative.   Respiratory: Negative.   Cardiovascular: Negative.   Gastrointestinal: Negative.   Genitourinary: Negative.   Musculoskeletal: Negative.   Skin: Negative.   Neurological: Negative.   Psychiatric/Behavioral: Negative.      OBJECTIVE: BP 123/69 (BP Location: Left Arm, Patient Position: Sitting, Cuff Size: Large)   Pulse 80   Temp 98.7 F (37.1 C) (Oral)   Resp 14   Ht 5\' 10"  (1.778 m)   Wt 272 lb (123.4 kg)   SpO2 100%   BMI 39.03 kg/m   Wt Readings from Last 3 Encounters:  07/17/18 272 lb (123.4 kg)  01/15/18 253 lb (114.8 kg)  07/16/17 259 lb (117.5 kg)     Physical Exam Vitals signs and nursing note reviewed.  Constitutional:      General: He is not in acute distress.    Appearance: Normal appearance.  HENT:     Head: Normocephalic and atraumatic.  Eyes:     Extraocular Movements: Extraocular movements intact.     Conjunctiva/sclera: Conjunctivae normal.     Pupils: Pupils are equal, round, and reactive to light.  Cardiovascular:     Rate and Rhythm: Normal rate and regular rhythm.     Heart sounds: No murmur.  Pulmonary:     Effort: Pulmonary effort is normal.     Breath sounds: Normal breath sounds.  Musculoskeletal: Normal range of motion.  Skin:    General: Skin is warm and dry.  Neurological:     Mental Status: He is alert and  oriented to person, place, and time.  Psychiatric:        Mood and Affect: Mood normal.        Behavior: Behavior normal.        Thought Content: Thought content normal.        Judgment: Judgment normal.     ASSESSMENT/PLAN:  1. Type 2 diabetes mellitus without complication, without long-term current use of insulin (HCC) Continue with current medications. The patient is asked to make an attempt to improve diet and exercise patterns to aid in medical management of this problem.  - HgB A1c- 6.6 but still at goal.  - Urinalysis Dipstick - Microalbumin/Creatinine Ratio, Urine     Return in about 3 months (around 10/17/2018) for DM.    The patient was given clear instructions to go to ER or return to medical center if symptoms do not improve, worsen or new problems develop. The patient verbalized understanding and agreed with plan of care.   Ms. Doug Sou. Nathaneil Canary, FNP-BC [facility] [address] [place], [place] [address] [phone]

## 2018-07-19 LAB — MICROALBUMIN / CREATININE URINE RATIO
Creatinine, Urine: 285.3 mg/dL
Microalb/Creat Ratio: 6 mg/g creat (ref 0–29)
Microalbumin, Urine: 17.6 ug/mL

## 2018-08-03 MED FILL — metFORMIN HCL 500 MG TABS: 500 | 30 days supply | Qty: 30 | Fill #3

## 2018-09-01 MED FILL — metFORMIN HCL 500 MG TABS: 500 | 30 days supply | Qty: 30 | Fill #4

## 2018-09-30 MED FILL — metFORMIN HCL 500 MG TABS: 500 | 30 days supply | Qty: 30 | Fill #5

## 2018-10-14 ENCOUNTER — Encounter (HOSPITAL_COMMUNITY): Payer: Self-pay | Admitting: *Deleted

## 2018-10-14 ENCOUNTER — Encounter (HOSPITAL_COMMUNITY): Payer: Self-pay

## 2018-10-19 ENCOUNTER — Other Ambulatory Visit: Payer: Self-pay

## 2018-10-19 ENCOUNTER — Encounter: Payer: Self-pay | Admitting: Family Medicine

## 2018-10-19 ENCOUNTER — Ambulatory Visit (INDEPENDENT_AMBULATORY_CARE_PROVIDER_SITE_OTHER): Payer: Self-pay | Admitting: Family Medicine

## 2018-10-19 VITALS — BP 116/54 | HR 102 | Temp 98.0°F | Resp 16 | Ht 70.0 in | Wt 282.0 lb

## 2018-10-19 DIAGNOSIS — E119 Type 2 diabetes mellitus without complications: Secondary | ICD-10-CM

## 2018-10-19 LAB — POCT GLYCOSYLATED HEMOGLOBIN (HGB A1C): Hemoglobin A1C: 6.6 % — AB (ref 4.0–5.6)

## 2018-10-19 NOTE — Progress Notes (Signed)
[facility]   Progress Note: General Provider: Lanae Boast, FNP  SUBJECTIVE:   Tyler Franklin is a 63 y.o. male who  has a past medical history of Diabetes mellitus without complication ([place]).. Patient presents today for Diabetes Since the last visit, patient has gained 10 pounds. Contributes this to the COVID quarantine. Patient reports compliance with all medications.  Patient denies side effects of medications.  Last A1C 6.6 on July 17, 2018.  He also lost his job and is discouraged about this, however he is in good spirits today.    Review of Systems  Constitutional: Negative.   HENT: Negative.   Eyes: Negative.   Respiratory: Negative.   Cardiovascular: Negative.   Gastrointestinal: Negative.   Genitourinary: Negative.   Musculoskeletal: Negative.   Skin: Negative.   Neurological: Negative.   Psychiatric/Behavioral: Negative.      OBJECTIVE: BP (!) 116/54 (BP Location: Left Arm, Patient Position: Sitting, Cuff Size: Large)   Pulse (!) 102   Temp 98 F (36.7 C) (Oral)   Resp 16   Ht 5\' 10"  (1.778 m)   Wt 282 lb (127.9 kg)   SpO2 99%   BMI 40.46 kg/m   Wt Readings from Last 3 Encounters:  10/19/18 282 lb (127.9 kg)  07/17/18 272 lb (123.4 kg)  01/15/18 253 lb (114.8 kg)     Physical Exam Vitals signs and nursing note reviewed.  Constitutional:      General: He is not in acute distress.    Appearance: Normal appearance.  HENT:     Head: Normocephalic and atraumatic.  Eyes:     Extraocular Movements: Extraocular movements intact.     Conjunctiva/sclera: Conjunctivae normal.     Pupils: Pupils are equal, round, and reactive to light.  Cardiovascular:     Rate and Rhythm: Normal rate and regular rhythm.     Heart sounds: No murmur.  Pulmonary:     Effort: Pulmonary effort is normal.     Breath sounds: Normal breath sounds.  Musculoskeletal: Normal range of motion.  Skin:    General: Skin is warm and dry.   Neurological:     Mental Status: He is alert and oriented to person, place, and time.  Psychiatric:        Mood and Affect: Mood normal.        Behavior: Behavior normal.        Thought Content: Thought content normal.        Judgment: Judgment normal.    Diabetic Foot Exam - Simple   Simple Foot Form Diabetic Foot exam was performed with the following findings: Yes 10/19/2018  3:31 PM  Visual Inspection See comments: Yes Sensation Testing Intact to touch and monofilament testing bilaterally: Yes Pulse Check Posterior Tibialis and Dorsalis pulse intact bilaterally: Yes Comments Mild callusing to the bilateral great toes. No other deformities noted.      ASSESSMENT/PLAN:   1. Type 2 diabetes mellitus without complication, without long-term current use of insulin (HCC) Continue with current medications. A1C today continues to be at goal at 6.6. Encouraged diet and exercise.  - HgB A1c - HM Diabetes Foot Exam - Basic Metabolic Panel    Return in about 3 months (around 01/18/2019) for DM.    The patient was given clear instructions to go to ER or return to medical center if symptoms do not improve, worsen or new problems develop. The patient verbalized understanding and agreed with plan of care.  Ms. Doug Sou. Nathaneil Canary, FNP-BC [facility] [address] [place], [place] [address] [phone]

## 2018-10-19 NOTE — Patient Instructions (Signed)

## 2018-10-20 LAB — BASIC METABOLIC PANEL WITH GFR
BUN/Creatinine Ratio: 9 — ABNORMAL LOW (ref 10–24)
BUN: 12 mg/dL (ref 8–27)
CO2: 23 mmol/L (ref 20–29)
Calcium: 9.4 mg/dL (ref 8.6–10.2)
Chloride: 103 mmol/L (ref 96–106)
Creatinine, Ser: 1.41 mg/dL — ABNORMAL HIGH (ref 0.76–1.27)
GFR calc Af Amer: 61 mL/min/1.73 (ref >59)
GFR calc non Af Amer: 53 mL/min/1.73 — ABNORMAL LOW (ref >59)
Glucose: 189 mg/dL — ABNORMAL HIGH (ref 65–99)
Potassium: 4 mmol/L (ref 3.5–5.2)
Sodium: 138 mmol/L (ref 134–144)

## 2018-10-21 ENCOUNTER — Telehealth: Payer: Self-pay

## 2018-10-21 NOTE — Telephone Encounter (Signed)
-----   Message from Lanae Boast, [place] sent at 10/21/2018  8:34 AM EDT ----- Your labs are stable. Continue with your current medications. Please remember to keep your follow up appointment. If you have problems, questions or concerns, please make an appointment to discuss. Thanks!

## 2018-10-21 NOTE — Progress Notes (Signed)
Your labs are stable. Continue with your current medications. Please remember to keep your follow up appointment. If you have problems, questions or concerns, please make an appointment to discuss. Thanks!

## 2018-10-21 NOTE — Telephone Encounter (Signed)
Called and spoke with patient, advised that labs are stable and to continue taking medications and keep next appointment. Patient verbalized understanding.

## 2018-11-09 ENCOUNTER — Other Ambulatory Visit: Payer: Self-pay | Admitting: Family Medicine

## 2018-11-09 DIAGNOSIS — E119 Type 2 diabetes mellitus without complications: Secondary | ICD-10-CM

## 2018-11-10 MED FILL — metFORMIN HCL 500 MG TABS: 500 | 30 days supply | Qty: 30 | Fill #0

## 2018-12-07 MED FILL — metFORMIN HCL 500 MG TABS: 500 | 30 days supply | Qty: 30 | Fill #1

## 2019-01-05 MED FILL — metFORMIN HCL 500 MG TABS: 500 | 30 days supply | Qty: 30 | Fill #2

## 2019-01-18 ENCOUNTER — Ambulatory Visit: Payer: Self-pay | Admitting: Family Medicine

## 2019-02-01 MED FILL — metFORMIN HCL 500 MG TABS: 500 | 30 days supply | Qty: 30 | Fill #3

## 2019-02-26 IMAGING — DX DG ABD PORTABLE 1V
2 series · 2 of 2 positions shown · non-contrast
Comparison: 01/02/2017

CLINICAL DATA: Ileus

EXAM:
PORTABLE ABDOMEN - 1 VIEW

[abdomen kub (1 of 2)]
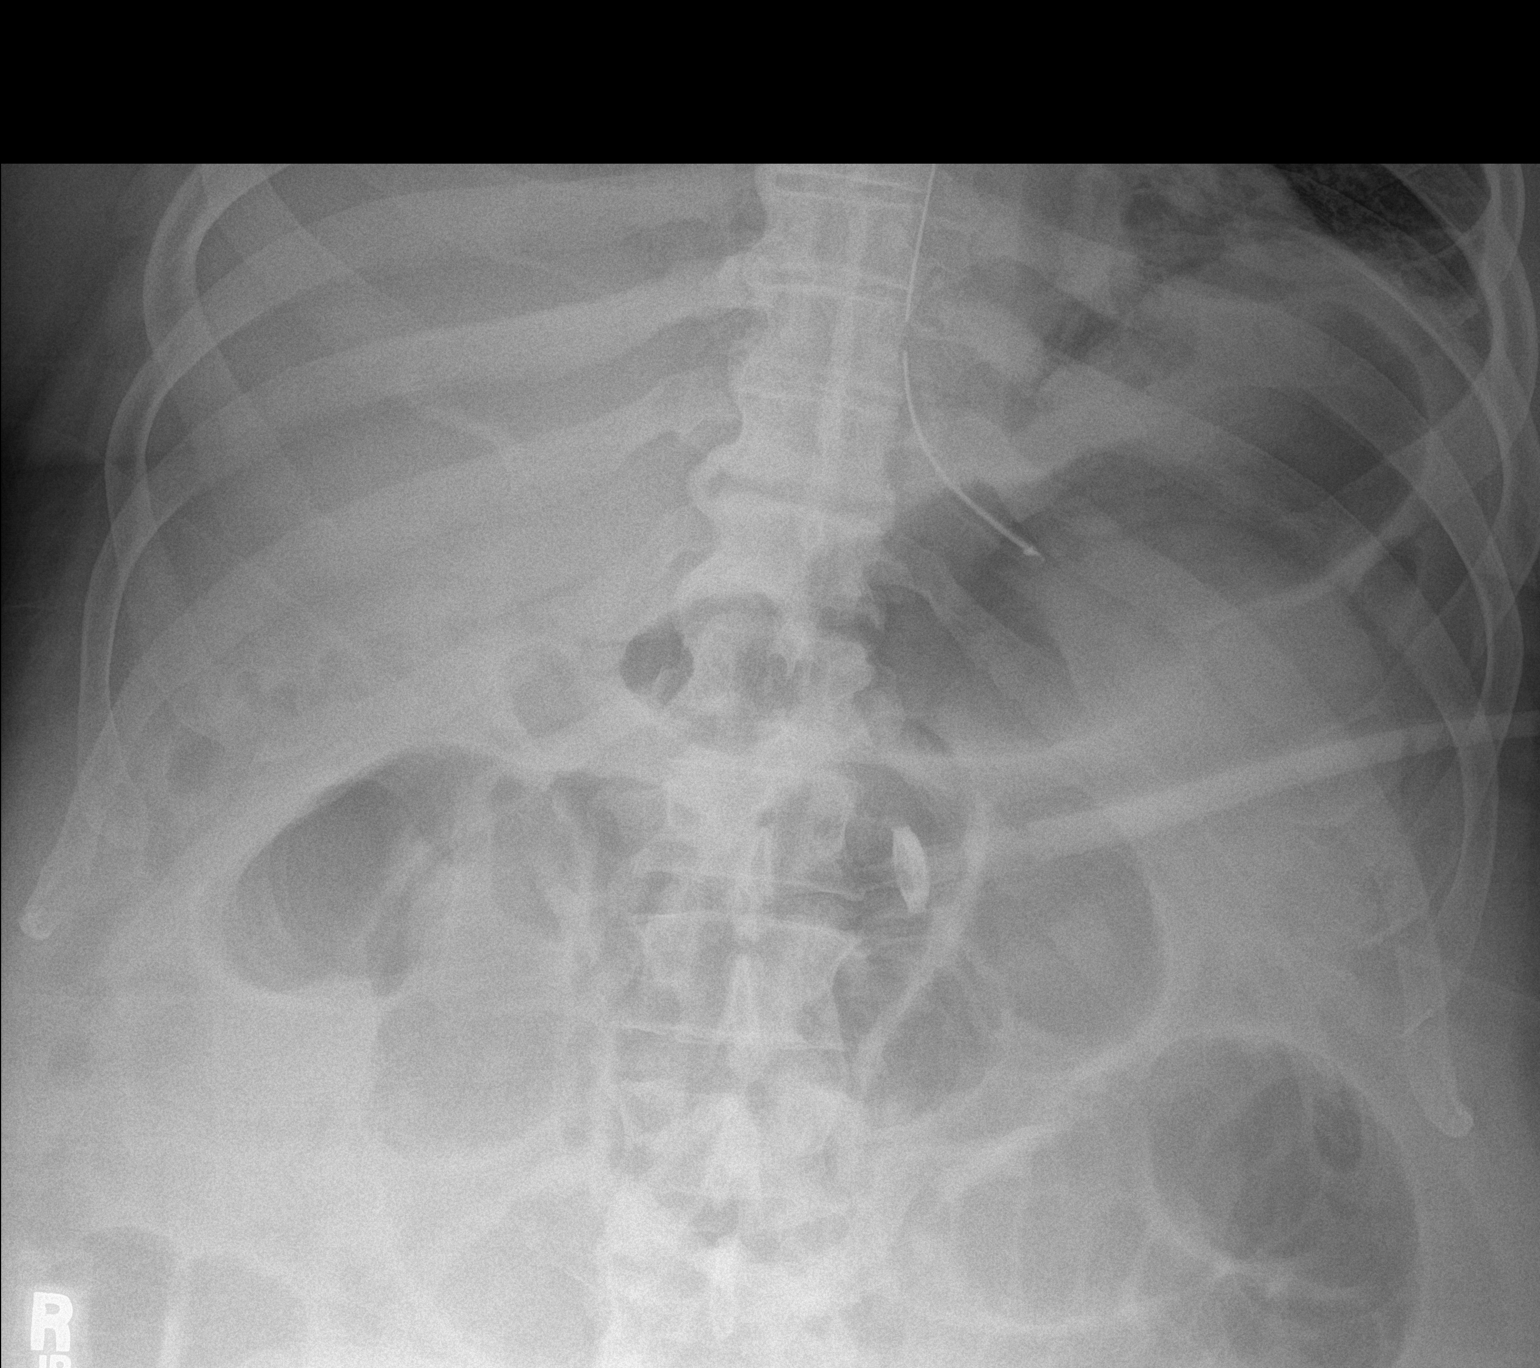

[abdomen kub (2 of 2)]
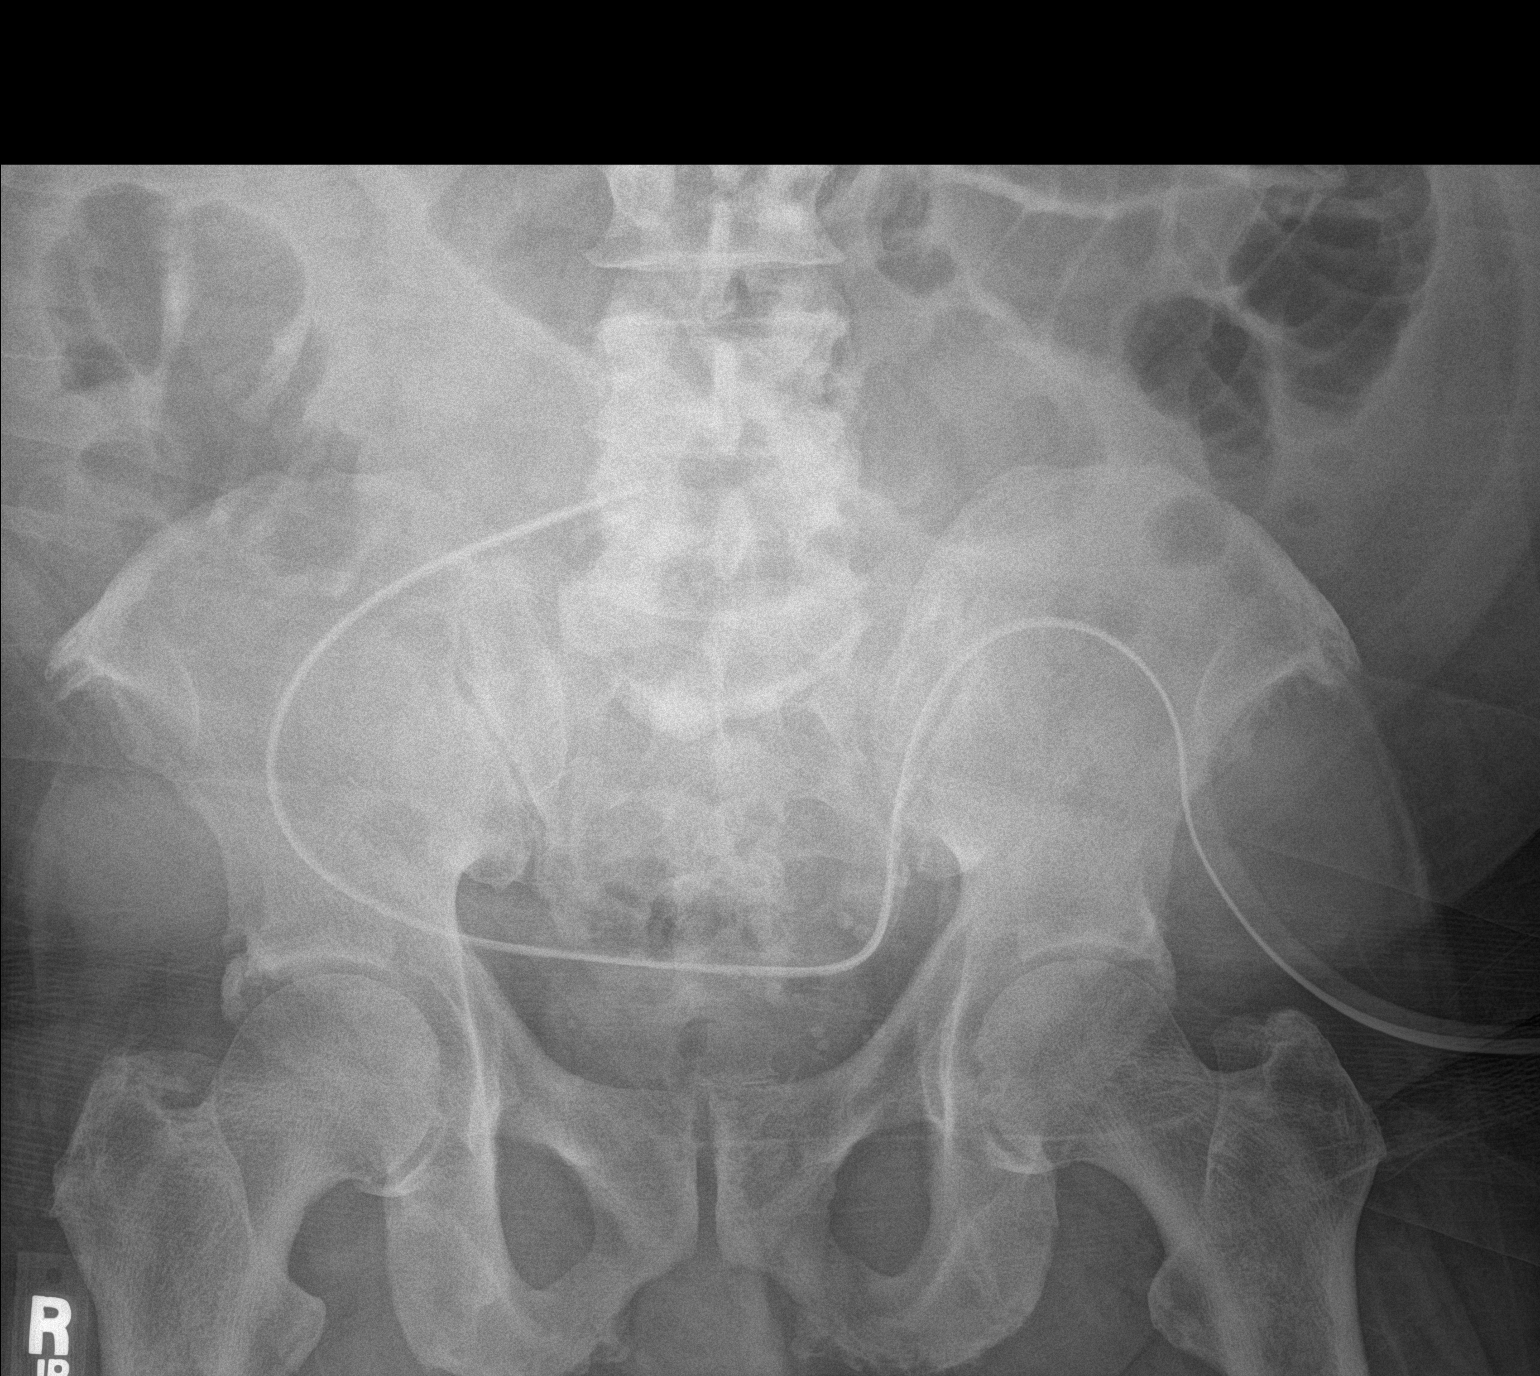

[2 of 2 positions shown; findings below may reference images not displayed]

FINDINGS: NG tube tip remains in the proximal stomach with the side port in
the distal esophagus. Gaseous distention of bowel again noted, most
pronounced involving the small bowel concerning for small bowel
obstruction. No real change since prior study.
IMPRESSION: Continued dilated small bowel loops concerning for small bowel
obstruction.

## 2019-02-28 IMAGING — CR DG ABDOMEN 1V
1 series · 1 of 1 positions shown · non-contrast
Comparison: Supine abdominal radiographs of January 04, 2017

CLINICAL DATA: Ileus, clinical improvement.

EXAM:
ABDOMEN - 1 VIEW

[abdomen kub]
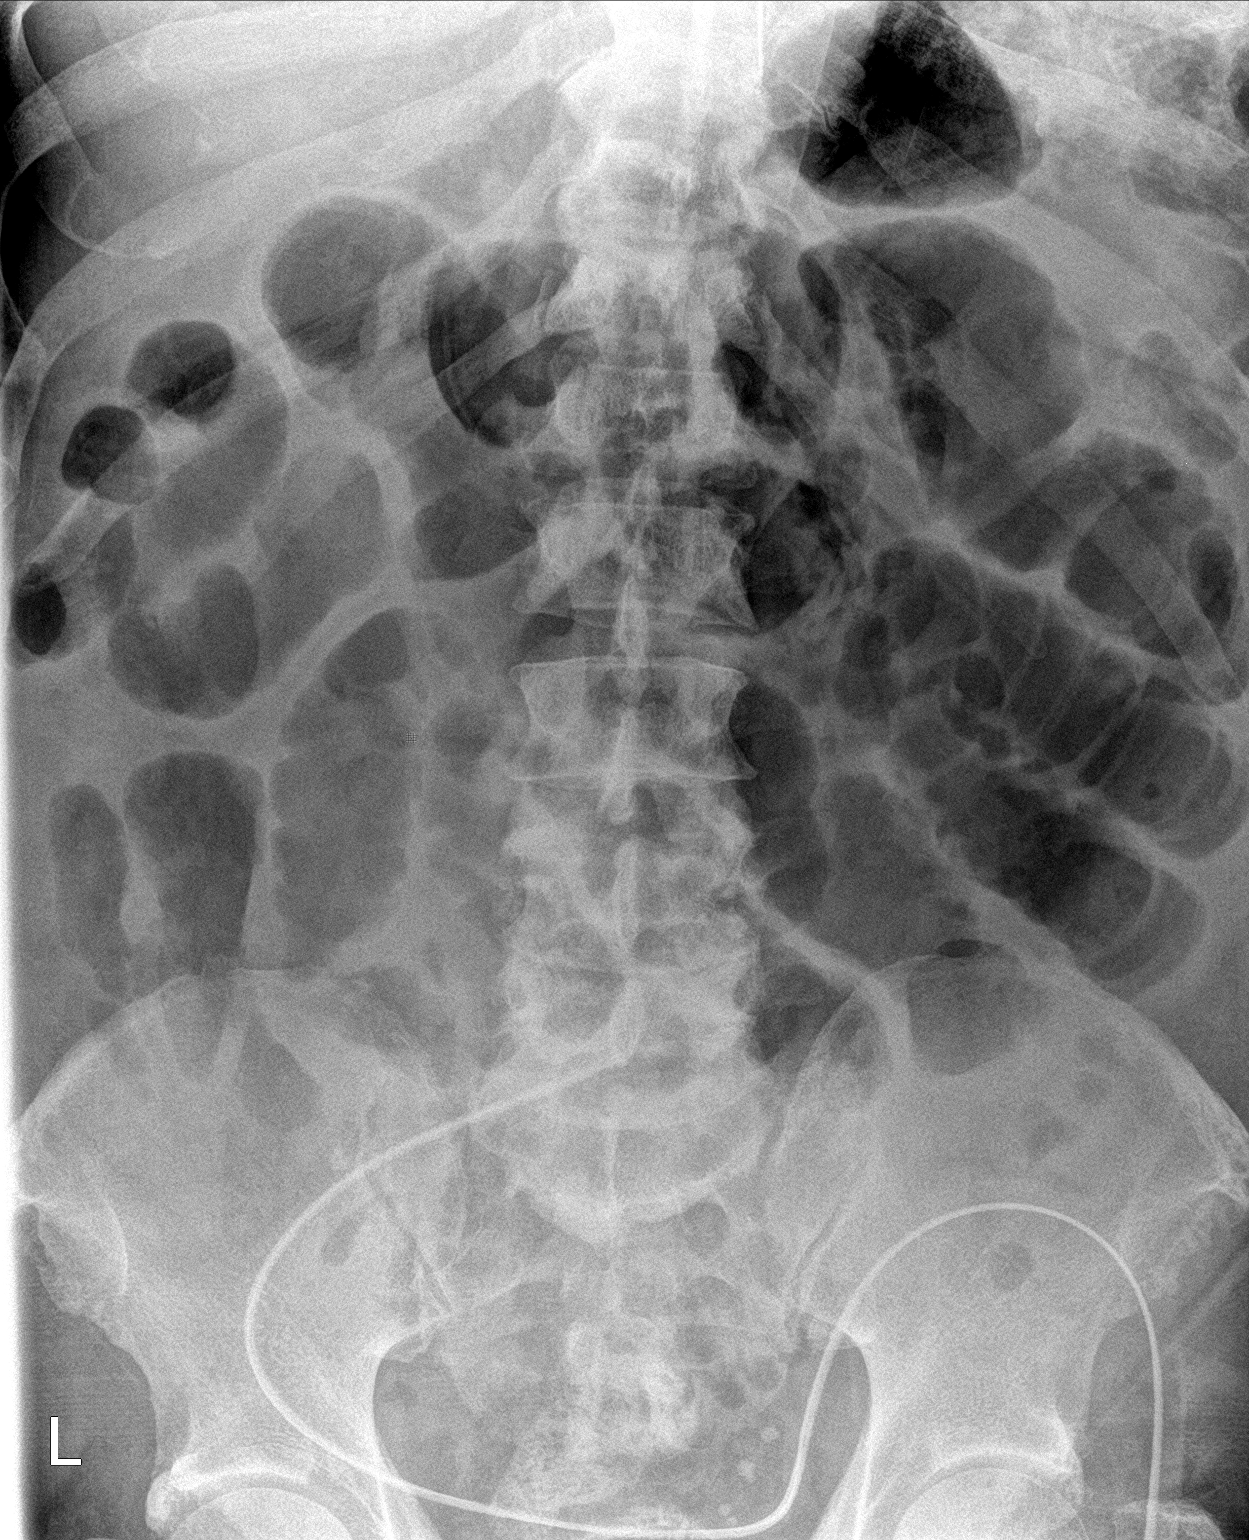

[1 of 1 positions shown; findings below may reference images not displayed]

FINDINGS: There remain loops of moderately distended gas-filled small bowel.
The degree of distention appears stable. There is no significant
large bowel gas observed. The abdominal and pelvic drainage catheter
is in stable position. The NG tube tip is faintly visible in the
upper aspect of the image but is not in appropriate position.
IMPRESSION: Fairly stable appearance of the small bowel distention compatible
with an ileus. The esophagogastric tube has been withdrawn and
advancement by at least 15-20 cm is recommended.

## 2019-02-28 IMAGING — DX DG CHEST 1V PORT
1 series · 1 of 1 positions shown · non-contrast
Comparison: CT abdomen and pelvis including lung bases January 02, 2017

CLINICAL DATA: Leukocytosis.

EXAM:
PORTABLE CHEST 1 VIEW

[chest ap]
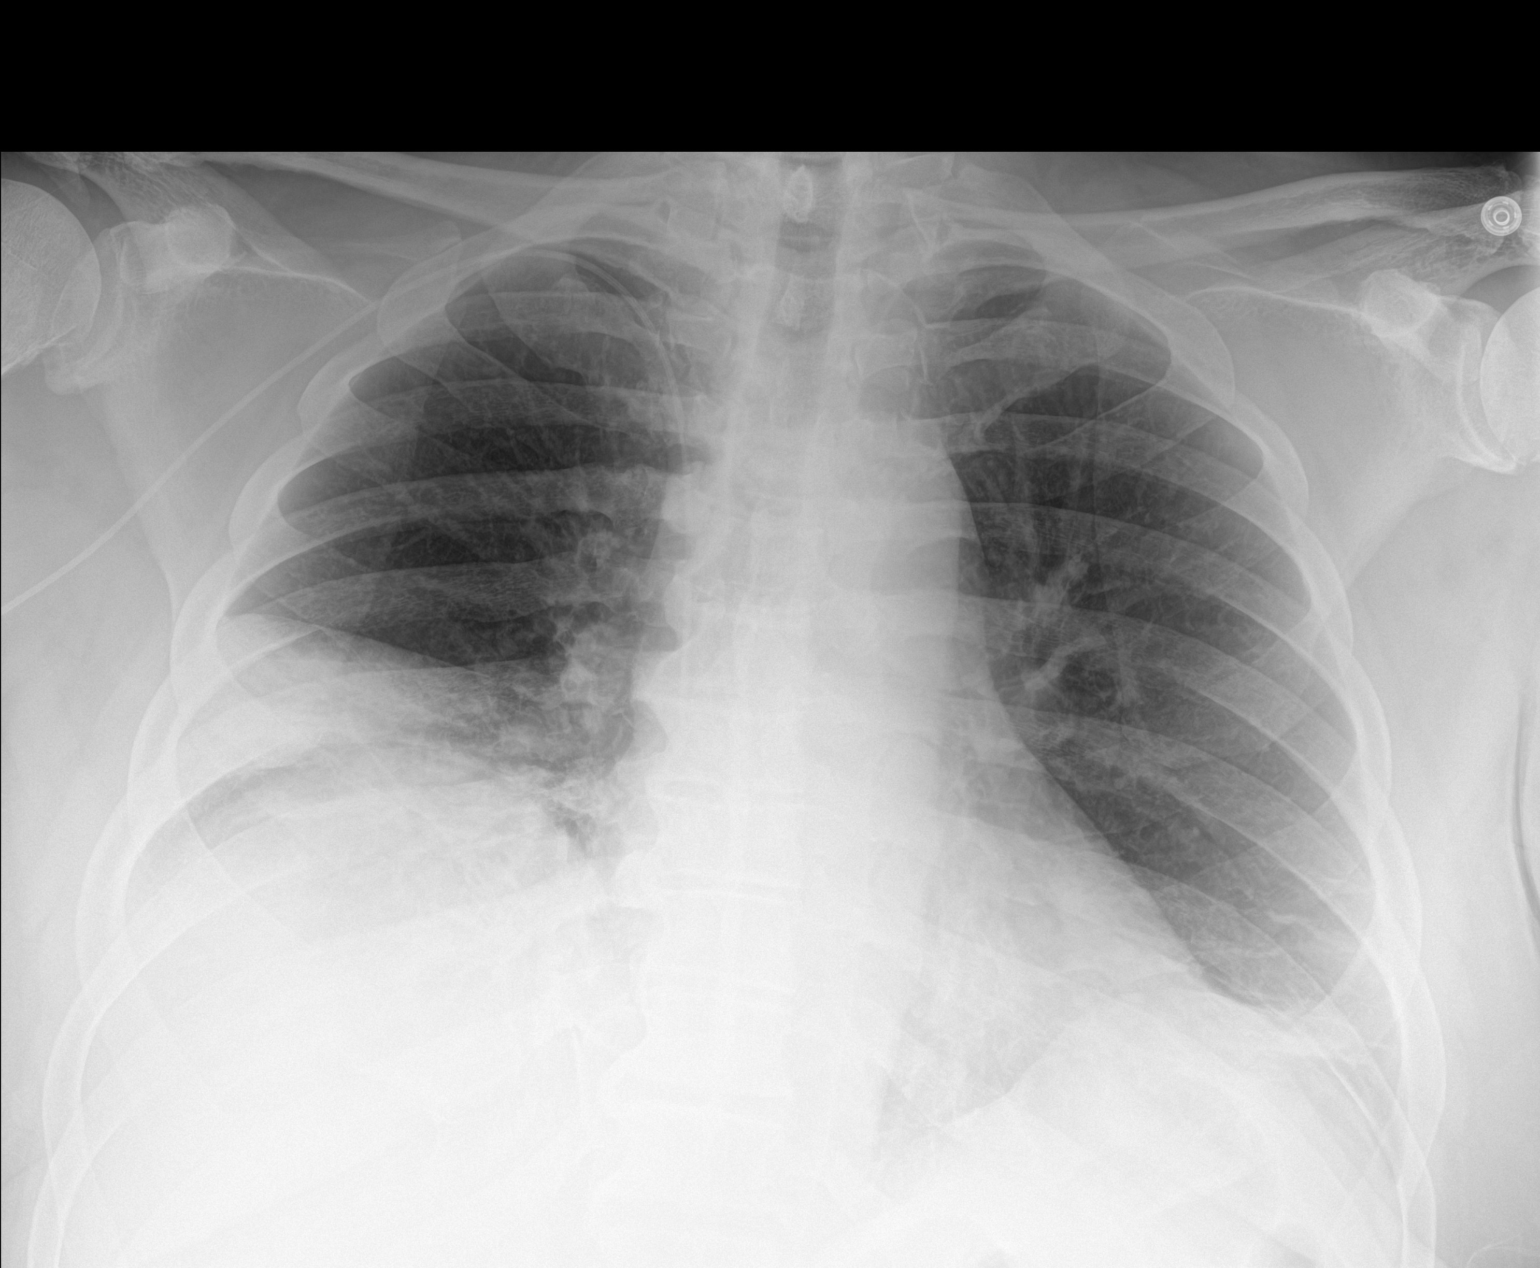

[1 of 1 positions shown; findings below may reference images not displayed]

FINDINGS: Central catheter tip is in the superior vena cava. No pneumothorax.
There is airspace consolidation in the right lower lung zone with
small right pleural effusion. There is atelectatic change left base.
Lungs elsewhere clear. Heart size and pulmonary vascularity are
normal. No adenopathy. There is degenerative change in the thoracic
spine.
IMPRESSION: Increase in consolidation right base with small right pleural
effusion. Left base atelectasis noted. Heart size within normal
limits. Central catheter tip in superior vena cava. No pneumothorax.

## 2019-03-08 MED FILL — metFORMIN HCL 500 MG TABS: 500 | 30 days supply | Qty: 30 | Fill #4

## 2019-04-07 MED FILL — metFORMIN HCL 500 MG TABS: 500 | 30 days supply | Qty: 30 | Fill #5

## 2019-04-12 ENCOUNTER — Telehealth: Payer: Self-pay | Admitting: Family Medicine

## 2019-04-12 NOTE — Telephone Encounter (Signed)
Pt was called and reminded of there appointment 

## 2019-04-12 NOTE — Telephone Encounter (Signed)
Pt called and reminded of appointment 

## 2019-04-13 ENCOUNTER — Ambulatory Visit (INDEPENDENT_AMBULATORY_CARE_PROVIDER_SITE_OTHER): Payer: Self-pay | Admitting: Family Medicine

## 2019-04-13 ENCOUNTER — Encounter: Payer: Self-pay | Admitting: Family Medicine

## 2019-04-13 ENCOUNTER — Other Ambulatory Visit: Payer: Self-pay

## 2019-04-13 VITALS — BP 127/70 | HR 90 | Temp 98.8°F | Resp 16 | Ht 70.0 in | Wt 282.0 lb

## 2019-04-13 DIAGNOSIS — E1122 Type 2 diabetes mellitus with diabetic chronic kidney disease: Secondary | ICD-10-CM

## 2019-04-13 DIAGNOSIS — E119 Type 2 diabetes mellitus without complications: Secondary | ICD-10-CM

## 2019-04-13 DIAGNOSIS — Z86711 Personal history of pulmonary embolism: Secondary | ICD-10-CM

## 2019-04-13 DIAGNOSIS — Z6841 Body Mass Index (BMI) 40.0 and over, adult: Secondary | ICD-10-CM

## 2019-04-13 LAB — POCT URINALYSIS DIPSTICK
Bilirubin, UA: NEGATIVE
Blood, UA: NEGATIVE
Glucose, UA: NEGATIVE
Ketones, UA: NEGATIVE
Leukocytes, UA: NEGATIVE
Nitrite, UA: NEGATIVE
Protein, UA: NEGATIVE
Spec Grav, UA: 1.01 (ref 1.010–1.025)
Urobilinogen, UA: 0.2 E.U./dL
pH, UA: 5.5 (ref 5.0–8.0)

## 2019-04-13 LAB — POCT GLYCOSYLATED HEMOGLOBIN (HGB A1C): Hemoglobin A1C: 7 % — AB (ref 4.0–5.6)

## 2019-04-13 NOTE — Patient Instructions (Addendum)
Please contact your pharmacy about prescription refills. In the event that you do not have refills, contact our office and we will respond within 24 hours.   COVID-19 Vaccine Information can be found at: [email or web address] For questions related to vaccine distribution or appointments, please email [email or web address] or call [phone].    3+

## 2019-04-13 NOTE — Progress Notes (Signed)
Established Patient Office Visit  Subjective:  Patient ID: Tyler Franklin, Tyler Franklin    DOB: 1955/05/19  Age: 64 y.o. MRN: [id number]  CC:  Chief Complaint  Patient presents with  . Diabetes   Tyler Franklin is a very pleasant 64 year old Tyler Franklin with a medical history of type 2 diabetes mellitus that presents for follow up. He says that he has been doing well and is without complaint.   Diabetes He presents for his follow-up diabetic visit. He has type 2 diabetes mellitus. His disease course has been stable. Pertinent negatives for diabetes include no blurred vision, no chest pain, no fatigue, no polydipsia, no polyphagia, no weakness and no weight loss. Symptoms are stable. Risk factors for coronary artery disease include diabetes mellitus.    Past Medical History:  Diagnosis Date  . Diabetes mellitus without complication [facility])     Past Surgical History:  Procedure Laterality Date  . LAPAROSCOPIC APPENDECTOMY N/A 12/28/2016   Procedure: APPENDECTOMY LAPAROSCOPIC;  Surgeon: Clovis Riley, MD;  Location: [facility];  Service: General;  Laterality: N/A;    Family History  Problem Relation Age of Onset  . Stroke Brother   . Deep vein thrombosis Brother     Social History   Socioeconomic History  . Marital status: Single    Spouse name: Not on file  . Number of children: Not on file  . Years of education: Not on file  . Highest education level: Not on file  Occupational History  . Not on file  Tobacco Use  . Smoking status: Never Smoker  . Smokeless tobacco: Never Used  Substance and Sexual Activity  . Alcohol use: Yes    Comment: occ  . Drug use: No  . Sexual activity: Not on file  Other Topics Concern  . Not on file  Social History Narrative  . Not on file   Social Determinants of Health   Financial Resource Strain:   . Difficulty of Paying Living Expenses: Not on file  Food Insecurity:   . Worried About Charity fundraiser in the Last Year: Not on file  . Ran Out  of Food in the Last Year: Not on file  Transportation Needs:   . Lack of Transportation (Medical): Not on file  . Lack of Transportation (Non-Medical): Not on file  Physical Activity:   . Days of Exercise per Week: Not on file  . Minutes of Exercise per Session: Not on file  Stress:   . Feeling of Stress : Not on file  Social Connections:   . Frequency of Communication with Friends and Family: Not on file  . Frequency of Social Gatherings with Friends and Family: Not on file  . Attends Religious Services: Not on file  . Active Member of Clubs or Organizations: Not on file  . Attends Archivist Meetings: Not on file  . Marital Status: Not on file  Intimate Partner Violence:   . Fear of Current or Ex-Partner: Not on file  . Emotionally Abused: Not on file  . Physically Abused: Not on file  . Sexually Abused: Not on file    Outpatient Medications Prior to Visit  Medication Sig Dispense Refill  . aspirin 81 MG tablet Take 81 mg by mouth daily.    . metFORMIN (GLUCOPHAGE) 500 MG tablet TAKE 1 TABLET (500 MG TOTAL) BY MOUTH DAILY WITH BREAKFAST. 30 tablet 5  . Multiple Vitamin (ONE-A-DAY MENS PO) Take 1 tablet by mouth daily.  No facility-administered medications prior to visit.    No Known Allergies  ROS Review of Systems  Constitutional: Positive for unexpected weight change (Weight gain). Negative for fatigue and weight loss.  HENT: Negative.   Eyes: Negative.  Negative for blurred vision.  Respiratory: Negative.   Cardiovascular: Negative for chest pain.  Endocrine: Negative for polydipsia and polyphagia.  Neurological: Negative for weakness.      Objective:    Physical Exam  Constitutional: He is oriented to person, place, and time. He appears well-developed and well-nourished.  HENT:  Head: Normocephalic.  Eyes: Pupils are equal, round, and reactive to light.  Cardiovascular: Normal rate and regular rhythm.  Pulmonary/Chest: Effort normal.   Abdominal: Soft. Bowel sounds are normal.  Musculoskeletal:        General: Normal range of motion.     Cervical back: Normal range of motion.  Neurological: He is alert and oriented to person, place, and time.  Skin: Skin is warm.  Psychiatric: He has a normal mood and affect. His behavior is normal. Judgment and thought content normal.    BP 127/70 (BP Location: Right Arm, Patient Position: Sitting, Cuff Size: Large)   Pulse 90   Temp 98.8 F (37.1 C) (Oral)   Resp 16   Ht 5\' 10"  (1.778 m)   Wt 282 lb (127.9 kg)   SpO2 100%   BMI 40.46 kg/m  Wt Readings from Last 3 Encounters:  04/13/19 282 lb (127.9 kg)  10/19/18 282 lb (127.9 kg)  07/17/18 272 lb (123.4 kg)     Health Maintenance Due  Topic Date Due  . OPHTHALMOLOGY EXAM  11/01/1965  . COLONOSCOPY  11/01/2005    There are no preventive care reminders to display for this patient.  No results found for: TSH Lab Results  Component Value Date   WBC 9.6 01/11/2017   HGB 13.3 01/11/2017   HCT 39.9 01/11/2017   MCV 91.9 01/11/2017   PLT 363 01/11/2017   Lab Results  Component Value Date   NA 138 10/19/2018   K 4.0 10/19/2018   CO2 23 10/19/2018   GLUCOSE 189 (H) 10/19/2018   BUN 12 10/19/2018   CREATININE 1.41 (H) 10/19/2018   BILITOT 1.2 01/09/2017   ALKPHOS 56 01/09/2017   AST 24 01/09/2017   ALT 42 01/09/2017   PROT 6.1 (L) 01/09/2017   ALBUMIN 1.9 (L) 01/09/2017   CALCIUM 9.4 10/19/2018   ANIONGAP 8 01/12/2017   Lab Results  Component Value Date   CHOL 132 01/16/2017   Lab Results  Component Value Date   HDL 25 (L) 01/16/2017   Lab Results  Component Value Date   LDLCALC 87 01/16/2017   Lab Results  Component Value Date   TRIG 102 01/16/2017   Lab Results  Component Value Date   CHOLHDL 5.3 (H) 01/16/2017   Lab Results  Component Value Date   HGBA1C 7.0 (A) 04/13/2019      Assessment & Plan:   Problem List Items Addressed This Visit      Endocrine   Type 2 diabetes mellitus  without complication, without long-term current use of insulin (HCC) - Primary   Relevant Orders   Urinalysis Dipstick   HgB A1c (Completed)   Basic Metabolic Panel      Type 2 diabetes mellitus without complication, without long-term current use of insulin (HCC) Hemoglobin a1c is 7.0, which is at goal. He does not warrant insulin at this time. Will continue with monotherapy. The patient is asked  to make an attempt to improve diet and exercise patterns to aid in medical management of this problem.   Type 2 diabetes mellitus with chronic kidney disease, without long-term current use of insulin, unspecified CKD stage (HCC)  - Urinalysis Dipstick - HgB A1c - Basic Metabolic Panel - Lipid Panel  History of pulmonary embolism Patient has completed anticoagulation therapy. No s/s of PE at this time.   Morbid obesity Estimated body mass index is 40.46 kg/m as calculated from the following:   Height as of this encounter: 5\' 10"  (1.778 m).   Weight as of this encounter: 282 lb (127.9 kg).   Follow-up: Return in about 3 months (around 07/14/2019) for diabetes.    09/13/2019  APRN, MSN, FNP-C Patient Care [facility] [address] [place], [place] [phone]

## 2019-04-14 ENCOUNTER — Telehealth: Payer: Self-pay

## 2019-04-14 LAB — LIPID PANEL
Chol/HDL Ratio: 3.7 ratio (ref 0.0–5.0)
Cholesterol, Total: 145 mg/dL (ref 100–199)
HDL: 39 mg/dL — ABNORMAL LOW (ref >39)
LDL Chol Calc (NIH): 84 mg/dL (ref 0–99)
Triglycerides: 119 mg/dL (ref 0–149)
VLDL Cholesterol Cal: 22 mg/dL (ref 5–40)

## 2019-04-14 LAB — BASIC METABOLIC PANEL
BUN/Creatinine Ratio: 9 — ABNORMAL LOW (ref 10–24)
BUN: 12 mg/dL (ref 8–27)
CO2: 23 mmol/L (ref 20–29)
Calcium: 9.8 mg/dL (ref 8.6–10.2)
Chloride: 102 mmol/L (ref 96–106)
Creatinine, Ser: 1.41 mg/dL — ABNORMAL HIGH (ref 0.76–1.27)
GFR calc Af Amer: 61 mL/min/{1.73_m2} (ref >59)
GFR calc non Af Amer: 53 mL/min/{1.73_m2} — ABNORMAL LOW (ref >59)
Glucose: 93 mg/dL (ref 65–99)
Potassium: 4.5 mmol/L (ref 3.5–5.2)
Sodium: 139 mmol/L (ref 134–144)

## 2019-04-14 NOTE — Telephone Encounter (Signed)
Called, no answer. Left a message to call back. Thanks!  

## 2019-04-14 NOTE — Telephone Encounter (Signed)
Patient returned call and I advised that creatinine was mildly elevated and that this indicates that kidney function is worsening. Asked that he eat a low fat/ low carb diet, exercise, and take medication consistently to try to improve this. Patient verbalized understanding and was asked to follow up in 3 months. Thanks!

## 2019-04-14 NOTE — Telephone Encounter (Signed)
-----   Message from Massie Maroon, [place] sent at 04/14/2019  6:17 AM EST ----- Regarding: lab results Please inform patient that creatinine is mildly elevated, which is an indication of worsening kidney functioning. It is important to follow a low fat, low carbohydrate diet, exercise, and take medication consistently in order to improve. Follow up in 3 months as scheduled.   Nolon Nations  APRN, MSN, FNP-C Patient Care [facility] [address] [place], [place] [address] [phone]

## 2019-04-16 DIAGNOSIS — E1122 Type 2 diabetes mellitus with diabetic chronic kidney disease: Secondary | ICD-10-CM | POA: Insufficient documentation

## 2019-05-06 ENCOUNTER — Other Ambulatory Visit: Payer: Self-pay | Admitting: Internal Medicine

## 2019-05-06 DIAGNOSIS — E119 Type 2 diabetes mellitus without complications: Secondary | ICD-10-CM

## 2019-05-10 ENCOUNTER — Telehealth: Payer: Self-pay | Admitting: Nurse Practitioner

## 2019-05-10 ENCOUNTER — Other Ambulatory Visit: Payer: Self-pay | Admitting: Family Medicine

## 2019-05-10 DIAGNOSIS — E119 Type 2 diabetes mellitus without complications: Secondary | ICD-10-CM

## 2019-05-10 MED ORDER — METFORMIN HCL 500 MG PO TABS: 500.0000 mg | ORAL_TABLET | Freq: Every day | ORAL | 5 refills | Status: DC

## 2019-05-10 MED FILL — metFORMIN HCL 500 MG TABS: 500 | 30 days supply | Qty: 30 | Fill #0

## 2019-05-10 NOTE — Progress Notes (Signed)
Meds ordered this encounter  Medications  . metFORMIN (GLUCOPHAGE) 500 MG tablet    Sig: Take 1 tablet (500 mg total) by mouth daily with breakfast.    Dispense:  30 tablet    Refill:  5    Order Specific Question:   Supervising Provider    Answer:   Quentin Angst [[id number]]    Nolon Nations  APRN, MSN, FNP-C Patient Care [facility] [address] [place], [place] [address] [phone]

## 2019-05-11 NOTE — Telephone Encounter (Signed)
done

## 2019-06-08 MED FILL — METFORMIN HCL 500 MG TABS: 500 | 30 days supply | Qty: 30 | Fill #1

## 2019-07-06 MED FILL — METFORMIN HCL 500 MG TABS: 500 | 30 days supply | Qty: 30 | Fill #2

## 2019-07-13 ENCOUNTER — Other Ambulatory Visit: Payer: Self-pay | Admitting: Family Medicine

## 2019-07-13 ENCOUNTER — Other Ambulatory Visit: Payer: Self-pay

## 2019-07-13 ENCOUNTER — Ambulatory Visit (INDEPENDENT_AMBULATORY_CARE_PROVIDER_SITE_OTHER): Payer: Self-pay | Admitting: Family Medicine

## 2019-07-13 ENCOUNTER — Encounter: Payer: Self-pay | Admitting: Family Medicine

## 2019-07-13 VITALS — BP 145/81 | HR 88 | Temp 98.2°F | Ht 70.0 in | Wt 282.0 lb

## 2019-07-13 DIAGNOSIS — E119 Type 2 diabetes mellitus without complications: Secondary | ICD-10-CM

## 2019-07-13 DIAGNOSIS — Z6841 Body Mass Index (BMI) 40.0 and over, adult: Secondary | ICD-10-CM

## 2019-07-13 LAB — POCT URINALYSIS DIPSTICK
Bilirubin, UA: NEGATIVE
Blood, UA: NEGATIVE
Glucose, UA: NEGATIVE
Ketones, UA: NEGATIVE
Leukocytes, UA: NEGATIVE
Nitrite, UA: NEGATIVE
Protein, UA: NEGATIVE
Spec Grav, UA: 1.025 (ref 1.010–1.025)
Urobilinogen, UA: 0.2 E.U./dL
pH, UA: 5.5 (ref 5.0–8.0)

## 2019-07-13 LAB — POCT GLYCOSYLATED HEMOGLOBIN (HGB A1C)
HbA1c POC (<> result, manual entry): 7.1 % (ref 4.0–5.6)
HbA1c, POC (controlled diabetic range): 7.1 % — AB (ref 0.0–7.0)
HbA1c, POC (prediabetic range): 7.1 % — AB (ref 5.7–6.4)
Hemoglobin A1C: 7.1 % — AB (ref 4.0–5.6)

## 2019-07-13 MED ORDER — TRUE METRIX METER W/DEVICE KIT: 1.0000 | PACK | Freq: Every day | 0 refills | Status: AC

## 2019-07-13 MED ORDER — TRUE METRIX BLOOD GLUCOSE TEST VI STRP: ORAL_STRIP | 12 refills | Status: DC

## 2019-07-13 MED ORDER — LISINOPRIL 5 MG PO TABS: 5.0000 mg | ORAL_TABLET | Freq: Every day | ORAL | 3 refills | Status: DC

## 2019-07-13 MED ORDER — METFORMIN HCL 500 MG PO TABS: 500.0000 mg | ORAL_TABLET | Freq: Two times a day (BID) | ORAL | 5 refills | Status: DC

## 2019-07-13 MED ORDER — LANCETS MISC: 1.0000 | Freq: Every day | 11 refills | Status: AC

## 2019-07-13 NOTE — Progress Notes (Signed)
Patient [facility]   Established Patient Office Visit  Subjective:  Patient ID: Tyler Franklin, male    DOB: 1955-06-18  Age: 64 y.o. MRN: [id number]  CC:  Chief Complaint  Patient presents with  . Follow-up    3 month follow up , dm , discuss diet for losing weight   . Diabetes    HPI Tyler Franklin is a 64 year old male with a medical history significant for type 2 diabetes mellitus, obesity, and history of pulmonary embolism presents for follow-up of type 2 diabetes.  Patient states that he has been doing well and is without complaint on today.  He says he has not been following a low-carb, low-fat diet.  He does not exercise routinely, but stays active around his home.  Patient is not up-to-date with routine eye exam due to insurance constraints.  He denies headache, dizziness, paresthesias, polyuria, polydipsia, or polyphagia.  Patient has been taking all medications consistently and without interruption.  He does not check blood glucose levels at home.  Past Medical History:  Diagnosis Date  . Diabetes mellitus without complication [facility])     Past Surgical History:  Procedure Laterality Date  . LAPAROSCOPIC APPENDECTOMY N/A 12/28/2016   Procedure: APPENDECTOMY LAPAROSCOPIC;  Surgeon: Clovis Riley, MD;  Location: [facility];  Service: General;  Laterality: N/A;    Family History  Problem Relation Age of Onset  . Stroke Brother   . Deep vein thrombosis Brother     Social History   Socioeconomic History  . Marital status: Single    Spouse name: Not on file  . Number of children: Not on file  . Years of education: Not on file  . Highest education level: Not on file  Occupational History  . Not on file  Tobacco Use  . Smoking status: Never Smoker  . Smokeless tobacco: Never Used  Substance and Sexual Activity  . Alcohol use: Not Currently    Comment: occ  . Drug use: No  . Sexual activity: Not Currently  Other Topics Concern   . Not on file  Social History Narrative  . Not on file   Social Determinants of Health   Financial Resource Strain:   . Difficulty of Paying Living Expenses:   Food Insecurity:   . Worried About Charity fundraiser in the Last Year:   . Arboriculturist in the Last Year:   Transportation Needs:   . Film/video editor (Medical):   Marland Kitchen Lack of Transportation (Non-Medical):   Physical Activity:   . Days of Exercise per Week:   . Minutes of Exercise per Session:   Stress:   . Feeling of Stress :   Social Connections:   . Frequency of Communication with Friends and Family:   . Frequency of Social Gatherings with Friends and Family:   . Attends Religious Services:   . Active Member of Clubs or Organizations:   . Attends Archivist Meetings:   Marland Kitchen Marital Status:   Intimate Partner Violence:   . Fear of Current or Ex-Partner:   . Emotionally Abused:   Marland Kitchen Physically Abused:   . Sexually Abused:     Outpatient Medications Prior to Visit  Medication Sig Dispense Refill  . aspirin 81 MG tablet Take 81 mg by mouth daily.    . metFORMIN (GLUCOPHAGE) 500 MG tablet Take 1 tablet (500 mg total) by mouth daily with breakfast. 30 tablet 5  . Multiple Vitamin (  ONE-A-DAY MENS PO) Take 1 tablet by mouth daily.     No facility-administered medications prior to visit.    No Known Allergies  ROS Review of Systems  HENT: Negative.   Respiratory: Negative.   Cardiovascular: Negative.   Gastrointestinal: Negative.   Endocrine: Negative for polydipsia, polyphagia and polyuria.  Genitourinary: Negative.   Musculoskeletal: Negative.   Neurological: Negative.   Hematological: Negative.   Psychiatric/Behavioral: Negative.       Objective:    Physical Exam  Constitutional: He is oriented to person, place, and time.  HENT:  Head: Normocephalic.  Eyes: Pupils are equal, round, and reactive to light.  Cardiovascular: Normal rate.  Pulmonary/Chest: Effort normal.  Abdominal:  Soft. Bowel sounds are normal.  Musculoskeletal:     Cervical back: Normal range of motion.  Neurological: He is alert and oriented to person, place, and time.  Skin: Skin is warm and dry.  Psychiatric: He has a normal mood and affect. His behavior is normal. Judgment and thought content normal.    BP (!) 145/81 (BP Location: Left Arm, Patient Position: Sitting, Cuff Size: Large)   Pulse 88   Temp 98.2 F (36.8 C) (Temporal)   Ht 5' 10"  (1.778 m)   Wt 282 lb (127.9 kg)   SpO2 98%   BMI 40.46 kg/m  Wt Readings from Last 3 Encounters:  07/13/19 282 lb (127.9 kg)  04/13/19 282 lb (127.9 kg)  10/19/18 282 lb (127.9 kg)     Health Maintenance Due  Topic Date Due  . OPHTHALMOLOGY EXAM  Never done  . COVID-19 Vaccine (1) Never done  . COLONOSCOPY  Never done  . URINE MICROALBUMIN  07/17/2019    There are no preventive care reminders to display for this patient.  No results found for: TSH Lab Results  Component Value Date   WBC 9.6 01/11/2017   HGB 13.3 01/11/2017   HCT 39.9 01/11/2017   MCV 91.9 01/11/2017   PLT 363 01/11/2017   Lab Results  Component Value Date   NA 139 04/13/2019   K 4.5 04/13/2019   CO2 23 04/13/2019   GLUCOSE 93 04/13/2019   BUN 12 04/13/2019   CREATININE 1.41 (H) 04/13/2019   BILITOT 1.2 01/09/2017   ALKPHOS 56 01/09/2017   AST 24 01/09/2017   ALT 42 01/09/2017   PROT 6.1 (L) 01/09/2017   ALBUMIN 1.9 (L) 01/09/2017   CALCIUM 9.8 04/13/2019   ANIONGAP 8 01/12/2017   Lab Results  Component Value Date   CHOL 145 04/13/2019   Lab Results  Component Value Date   HDL 39 (L) 04/13/2019   Lab Results  Component Value Date   LDLCALC 84 04/13/2019   Lab Results  Component Value Date   TRIG 119 04/13/2019   Lab Results  Component Value Date   CHOLHDL 3.7 04/13/2019   Lab Results  Component Value Date   HGBA1C 7.1 (A) 07/13/2019   HGBA1C 7.1 07/13/2019   HGBA1C 7.1 (A) 07/13/2019   HGBA1C 7.1 (A) 07/13/2019      Assessment &  Plan:   Problem List Items Addressed This Visit      Endocrine   Type 2 diabetes mellitus without complication, without long-term current use of insulin (HCC) - Primary   Relevant Orders   HgB A1c (Completed)   POCT Urinalysis Dipstick (Completed)   Microalbumin/Creatinine Ratio, Urine      Type 2 diabetes mellitus without complication, without long-term current use of insulin (HCC) Hemoglobin A1c is 7.1, which  is slightly increased from previous.  Increase Metformin to 500 mg twice daily.  We will recheck hemoglobin A1c in 3 months.  Patient given written information on carbohydrate modified diet and exercise plan. Also, patient will start on lisinopril 5 mg daily. Patient advised that we will check cholesterol level at next visit. - HgB A1c - POCT Urinalysis Dipstick - Microalbumin/Creatinine Ratio, Urine - metFORMIN (GLUCOPHAGE) 500 MG tablet; Take 1 tablet (500 mg total) by mouth 2 (two) times daily with a meal.  Dispense: 60 tablet; Refill: 5 - lisinopril (ZESTRIL) 5 MG tablet; Take 1 tablet (5 mg total) by mouth daily.  Dispense: 90 tablet; Refill: 3 - Blood Glucose Monitoring Suppl (TRUE METRIX METER) w/Device KIT; 1 each by Does not apply route daily.  Dispense: 1 kit; Refill: 0 - glucose blood (TRUE METRIX BLOOD GLUCOSE TEST) test strip; Use as instructed  Dispense: 100 each; Refill: 12 - Lancets MISC; 1 each by Does not apply route daily.  Dispense: 60 each; Refill: 11 - Basic Metabolic Panel  Morbid obesity BMI 40-45 The patient is asked to make an attempt to improve diet and exercise patterns to aid in medical management of this problem. Body mass index is 40.46 kg/m.   Follow-up: Return in about 3 months (around 10/13/2019).    Donia Pounds  APRN, MSN, FNP-C Patient [facility] [address] [place], [place] [address] [phone]

## 2019-07-13 NOTE — Patient Instructions (Addendum)
COVID-19 Vaccine Information can be found at: [email or web address].nl For questions related to vaccine distribution or appointments, please email [email or web address] or call [phone].    Diabetes Mellitus and Nutrition, Adult When you have diabetes (diabetes mellitus), it is very important to have healthy eating habits because your blood sugar (glucose) levels are greatly affected by what you eat and drink. Eating healthy foods in the appropriate amounts, at about the same times every day, can help you:  Control your blood glucose.  Lower your risk of heart disease.  Improve your blood pressure.  Reach or maintain a healthy weight. Every person with diabetes is different, and each person has different needs for a meal plan. Your health care provider may recommend that you work with a diet and nutrition specialist (dietitian) to make a meal plan that is best for you. Your meal plan may vary depending on factors such as:  The calories you need.  The medicines you take.  Your weight.  Your blood glucose, blood pressure, and cholesterol levels.  Your activity level.  Other health conditions you have, such as heart or kidney disease. How do carbohydrates affect me? Carbohydrates, also called carbs, affect your blood glucose level more than any other type of food. Eating carbs naturally raises the amount of glucose in your blood. Carb counting is a method for keeping track of how many carbs you eat. Counting carbs is important to keep your blood glucose at a healthy level, especially if you use insulin or take certain oral diabetes medicines. It is important to know how many carbs you can safely have in each meal. This is different for every person. Your dietitian can help you calculate how many carbs you should have at each meal and for each snack. Foods that contain carbs include:  Bread, cereal, rice, pasta, and  crackers.  Potatoes and corn.  Peas, beans, and lentils.  Milk and yogurt.  Fruit and juice.  Desserts, such as cakes, cookies, ice cream, and candy. How does alcohol affect me? Alcohol can cause a sudden decrease in blood glucose (hypoglycemia), especially if you use insulin or take certain oral diabetes medicines. Hypoglycemia can be a life-threatening condition. Symptoms of hypoglycemia (sleepiness, dizziness, and confusion) are similar to symptoms of having too much alcohol. If your health care provider says that alcohol is safe for you, follow these guidelines:  Limit alcohol intake to no more than 1 drink per day for nonpregnant women and 2 drinks per day for men. One drink equals 12 oz of beer, 5 oz of wine, or 1 oz of hard liquor.  Do not drink on an empty stomach.  Keep yourself hydrated with water, diet soda, or unsweetened iced tea.  Keep in mind that regular soda, juice, and other mixers may contain a lot of sugar and must be counted as carbs. What are tips for following this plan?  Reading food labels  Start by checking the serving size on the "Nutrition Facts" label of packaged foods and drinks. The amount of calories, carbs, fats, and other nutrients listed on the label is based on one serving of the item. Many items contain more than one serving per package.  Check the total grams (g) of carbs in one serving. You can calculate the number of servings of carbs in one serving by dividing the total carbs by 15. For example, if a food has 30 g of total carbs, it would be equal to 2 servings of carbs.  Check the number of grams (  g) of saturated and trans fats in one serving. Choose foods that have low or no amount of these fats.  Check the number of milligrams (mg) of salt (sodium) in one serving. Most people should limit total sodium intake to less than 2,300 mg per day.  Always check the nutrition information of foods labeled as "low-fat" or "nonfat". These foods may be  higher in added sugar or refined carbs and should be avoided.  Talk to your dietitian to identify your daily goals for nutrients listed on the label. Shopping  Avoid buying canned, premade, or processed foods. These foods tend to be high in fat, sodium, and added sugar.  Shop around the outside edge of the grocery store. This includes fresh fruits and vegetables, bulk grains, fresh meats, and fresh dairy. Cooking  Use low-heat cooking methods, such as baking, instead of high-heat cooking methods like deep frying.  Cook using healthy oils, such as olive, canola, or sunflower oil.  Avoid cooking with butter, cream, or high-fat meats. Meal planning  Eat meals and snacks regularly, preferably at the same times every day. Avoid going long periods of time without eating.  Eat foods high in fiber, such as fresh fruits, vegetables, beans, and whole grains. Talk to your dietitian about how many servings of carbs you can eat at each meal.  Eat 4-6 ounces (oz) of lean protein each day, such as lean meat, chicken, fish, eggs, or tofu. One oz of lean protein is equal to: ? 1 oz of meat, chicken, or fish. ? 1 egg. ?  cup of tofu.  Eat some foods each day that contain healthy fats, such as avocado, nuts, seeds, and fish. Lifestyle  Check your blood glucose regularly.  Exercise regularly as told by your health care provider. This may include: ? 150 minutes of moderate-intensity or vigorous-intensity exercise each week. This could be brisk walking, biking, or water aerobics. ? Stretching and doing strength exercises, such as yoga or weightlifting, at least 2 times a week.  Take medicines as told by your health care provider.  Do not use any products that contain nicotine or tobacco, such as cigarettes and e-cigarettes. If you need help quitting, ask your health care provider.  Work with a Veterinary surgeon or diabetes educator to identify strategies to manage stress and any emotional and social  challenges. Questions to ask a health care provider  Do I need to meet with a diabetes educator?  Do I need to meet with a dietitian?  What number can I call if I have questions?  When are the best times to check my blood glucose? Where to find more information:  American Diabetes Association: diabetes.org  Academy of Nutrition and Dietetics: www.eatright.[employer] of Diabetes and Digestive and Kidney Diseases (NIH): CarFlippers.tn Summary  A healthy meal plan will help you control your blood glucose and maintain a healthy lifestyle.  Working with a diet and nutrition specialist (dietitian) can help you make a meal plan that is best for you.  Keep in mind that carbohydrates (carbs) and alcohol have immediate effects on your blood glucose levels. It is important to count carbs and to use alcohol carefully. This information is not intended to replace advice given to you by your health care provider. Make sure you discuss any questions you have with your health care provider. Document Revised: 01/03/2017 Document Reviewed: 02/26/2016 Elsevier Patient Education  2020 [employer].

## 2019-07-14 LAB — BASIC METABOLIC PANEL
BUN/Creatinine Ratio: 12 (ref 10–24)
BUN: 17 mg/dL (ref 8–27)
CO2: 23 mmol/L (ref 20–29)
Calcium: 10 mg/dL (ref 8.6–10.2)
Chloride: 100 mmol/L (ref 96–106)
Creatinine, Ser: 1.38 mg/dL — ABNORMAL HIGH (ref 0.76–1.27)
GFR calc Af Amer: 62 mL/min/{1.73_m2} (ref >59)
GFR calc non Af Amer: 54 mL/min/{1.73_m2} — ABNORMAL LOW (ref >59)
Glucose: 171 mg/dL — ABNORMAL HIGH (ref 65–99)
Potassium: 4.5 mmol/L (ref 3.5–5.2)
Sodium: 134 mmol/L (ref 134–144)

## 2019-07-14 LAB — MICROALBUMIN / CREATININE URINE RATIO
Creatinine, Urine: 139.3 mg/dL
Microalb/Creat Ratio: 5 mg/g creat (ref 0–29)
Microalbumin, Urine: 6.8 ug/mL

## 2019-07-15 ENCOUNTER — Other Ambulatory Visit: Payer: Self-pay | Admitting: Family Medicine

## 2019-07-15 ENCOUNTER — Telehealth: Payer: Self-pay

## 2019-07-15 ENCOUNTER — Other Ambulatory Visit: Payer: Self-pay

## 2019-07-15 DIAGNOSIS — E119 Type 2 diabetes mellitus without complications: Secondary | ICD-10-CM

## 2019-07-15 MED ORDER — METFORMIN HCL 500 MG PO TABS: 500.0000 mg | ORAL_TABLET | Freq: Two times a day (BID) | ORAL | 5 refills | Status: DC

## 2019-07-15 NOTE — Telephone Encounter (Signed)
Called inform patient ,  Pt understood , pt stated that he didn't receive his metiformin from the pharmacy, [facility] P

## 2019-07-15 NOTE — Telephone Encounter (Signed)
-----   Message from Massie Maroon, [place] sent at 07/14/2019 12:04 PM EDT ----- Regarding: lab results Please inform patient that creatinine, an indicator of kidney functioning is mildly elevated, will continue to monitor closely. He will need to start Lisinopril 5 mg daily and Metformin was increased to 500 mg twice daily.   Nolon Nations  APRN, MSN, FNP-C Patient Care [facility] [address] [place], [place] [address] [phone]

## 2019-08-16 MED FILL — LISINOPRIL 5 MG TABLET: 5 | 30 days supply | Qty: 30 | Fill #1

## 2019-08-16 MED FILL — METFORMIN HCL 500 MG TABS: 500 | 30 days supply | Qty: 30 | Fill #3

## 2019-09-16 MED FILL — METFORMIN HCL 500 MG TABS: 500 | 30 days supply | Qty: 30 | Fill #4

## 2019-09-16 MED FILL — LISINOPRIL 5 MG TABLET: 5 | 30 days supply | Qty: 30 | Fill #2

## 2019-10-15 MED FILL — TRUE METRIX TEST STRIP: 25 days supply | Qty: 100 | Fill #1

## 2019-10-15 MED FILL — METFORMIN HCL 500 MG TABS: 500 | 30 days supply | Qty: 30 | Fill #5

## 2019-10-15 MED FILL — LISINOPRIL 5 MG TABLET: 5 | 30 days supply | Qty: 30 | Fill #3

## 2019-10-19 ENCOUNTER — Ambulatory Visit: Payer: Self-pay | Admitting: Family Medicine

## 2019-11-03 IMAGING — US US ABDOMEN LIMITED
1 series · 14 of 25 positions shown · non-contrast
Comparison: 01/02/2017

CLINICAL DATA: Abdominal pain since yesterday

EXAM:
ULTRASOUND ABDOMEN LIMITED RIGHT UPPER QUADRANT

[Series 1: us abdomen limited · 0.26mm/px · 14 of 38 slices shown]
[im 1/38]
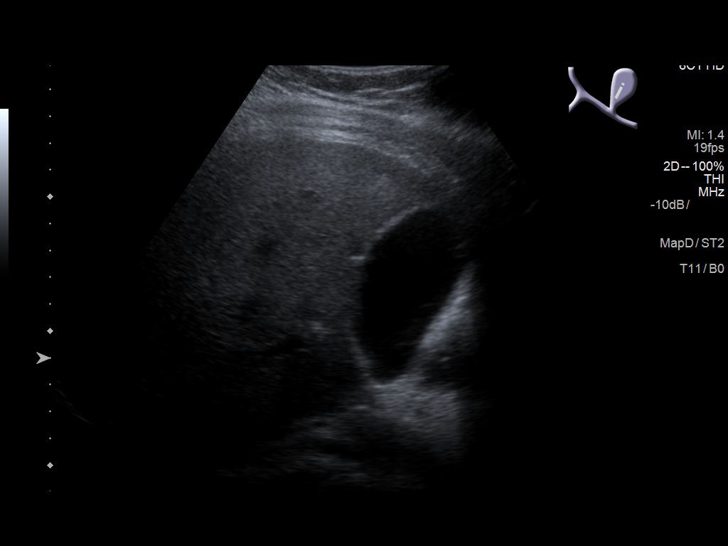
[im 4/38]
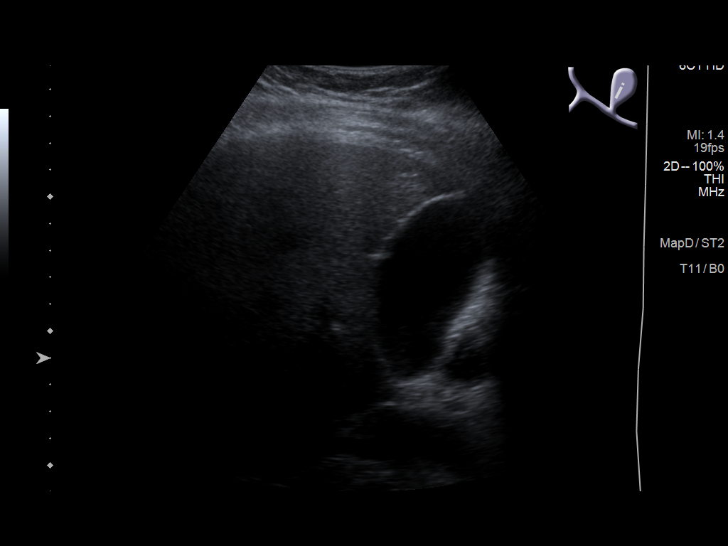
[im 7/38]
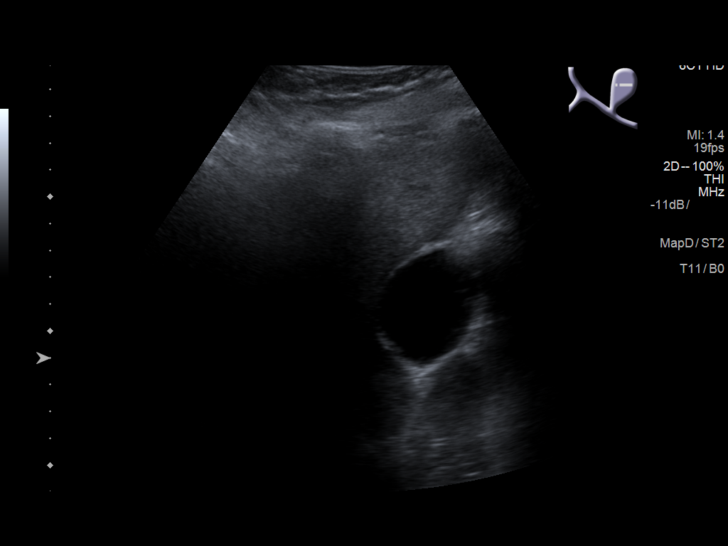
[im 10/38]
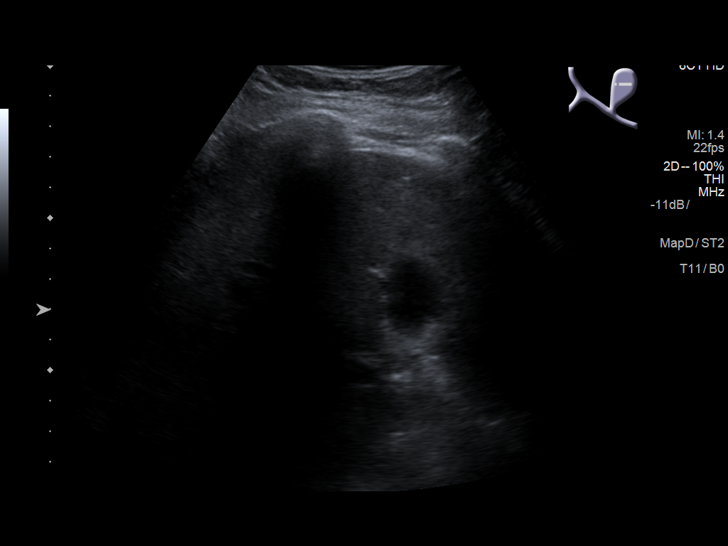
[im 13/38]
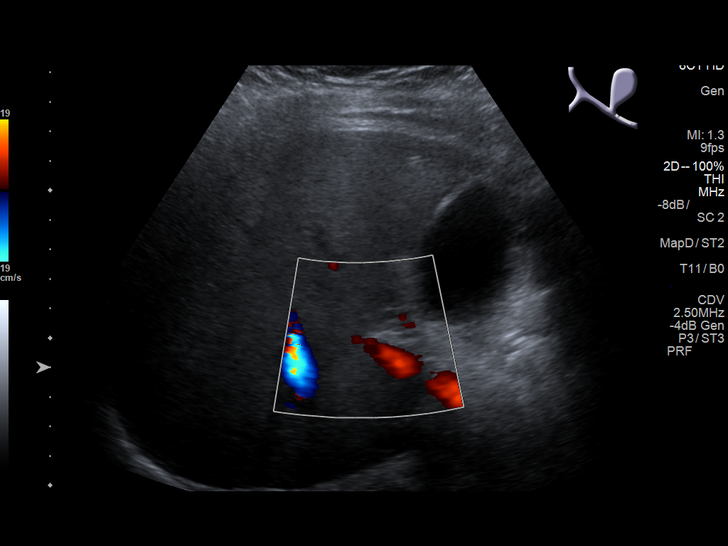
[im 14/38]
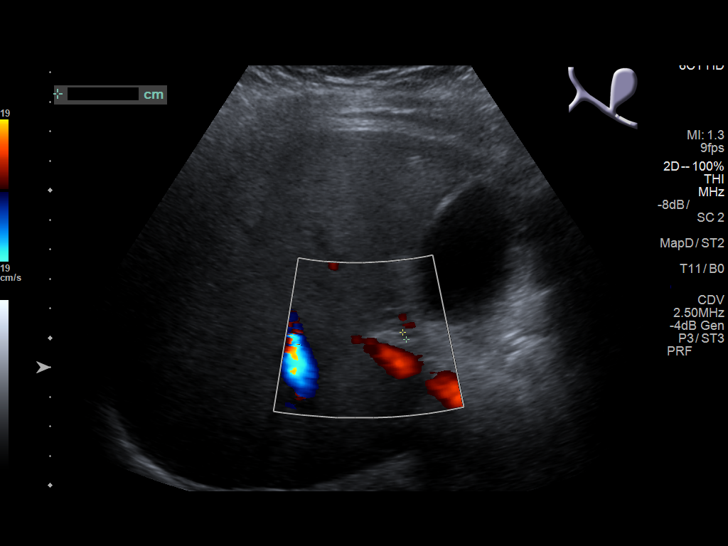
[im 17/38]
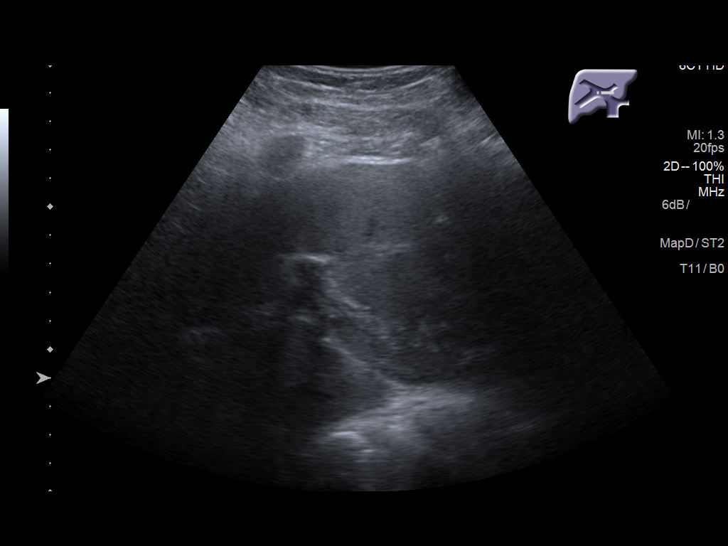
[im 21/38]
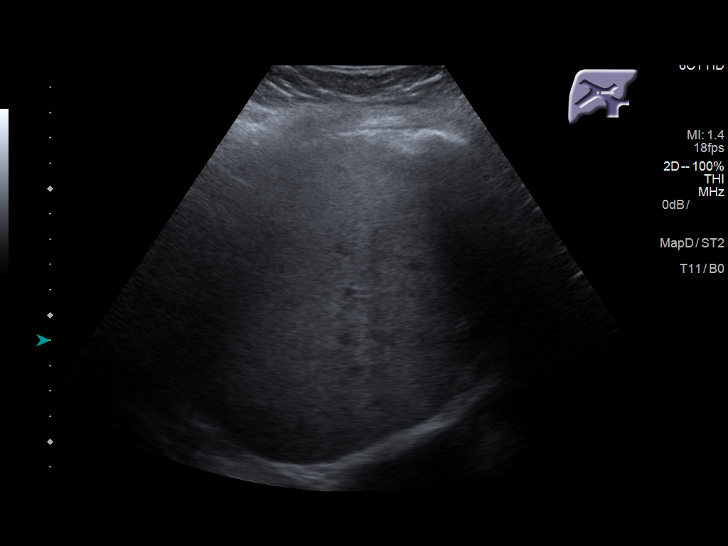
[im 24/38]
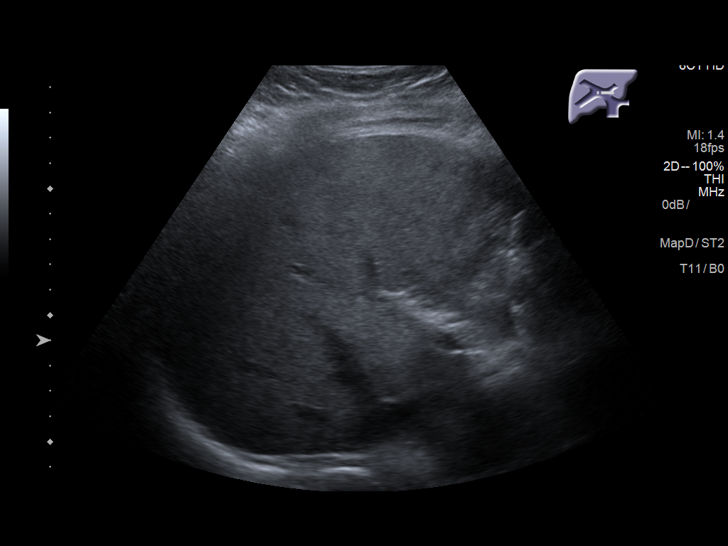
[im 25/38]
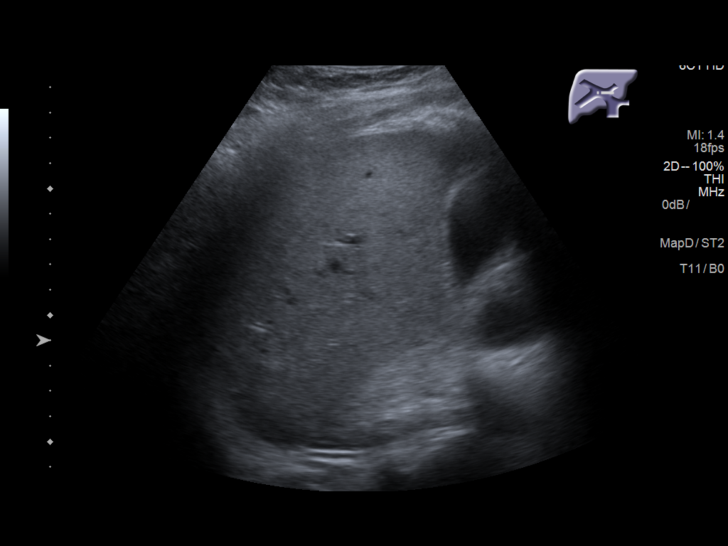
[im 28/38]
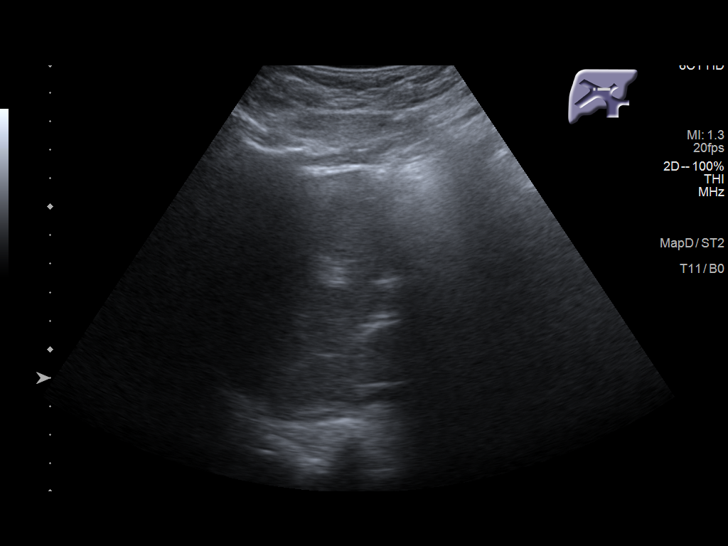
[im 31/38]
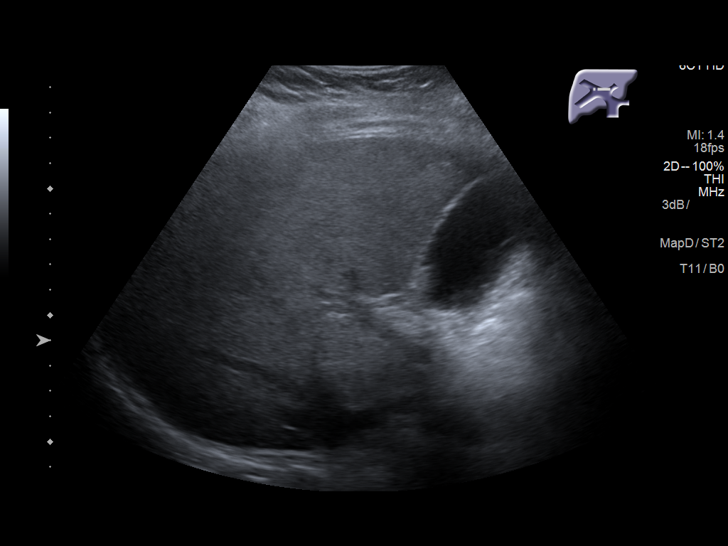
[im 34/38]
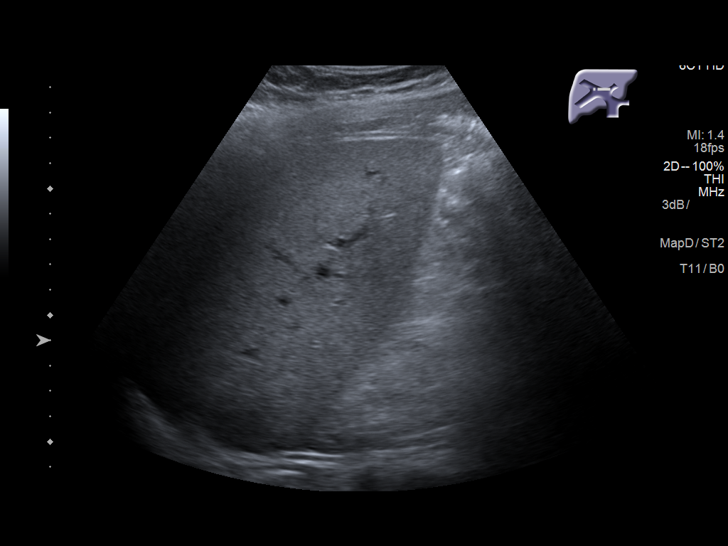
[im 38/38]
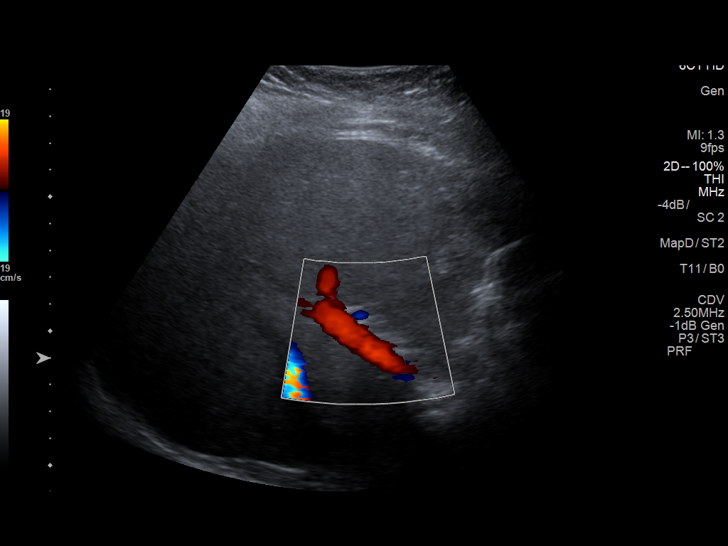

[14 of 25 positions shown; findings below may reference images not displayed]

FINDINGS: Gallbladder:

No gallstones or wall thickening visualized. No sonographic Murphy
sign noted by sonographer.

Common bile duct:

Diameter: 3 mm. The duct is difficult to visualize and only
partially seen.

Liver:

Echogenic liver with diminished acoustic penetration. No evidence of
mass. Portal vein is patent on color Doppler imaging with normal
direction of blood flow towards the liver.
IMPRESSION: 1. No acute finding.  Negative gallbladder.
2. Hepatic steatosis.

## 2019-11-16 MED FILL — METFORMIN HCL 500 MG TABS: 500 | 30 days supply | Qty: 60 | Fill #0

## 2019-11-16 MED FILL — LISINOPRIL 5 MG TABLET: 5 | 30 days supply | Qty: 30 | Fill #4

## 2019-11-17 ENCOUNTER — Telehealth: Payer: Self-pay | Admitting: Family Medicine

## 2019-11-18 ENCOUNTER — Other Ambulatory Visit: Payer: Self-pay | Admitting: Family Medicine

## 2019-11-18 DIAGNOSIS — E119 Type 2 diabetes mellitus without complications: Secondary | ICD-10-CM

## 2019-11-18 MED ORDER — METFORMIN HCL 500 MG PO TABS: 500.0000 mg | ORAL_TABLET | Freq: Two times a day (BID) | ORAL | 5 refills | Status: DC

## 2019-11-18 NOTE — Telephone Encounter (Signed)
Done

## 2019-11-18 NOTE — Progress Notes (Signed)
Meds ordered this encounter  Medications  . metFORMIN (GLUCOPHAGE) 500 MG tablet    Sig: Take 1 tablet (500 mg total) by mouth 2 (two) times daily with a meal.    Dispense:  60 tablet    Refill:  5    Order Specific Question:   Supervising Provider    Answer:   Quentin Angst [[id number]]

## 2019-12-17 ENCOUNTER — Telehealth: Payer: Self-pay | Admitting: Family Medicine

## 2019-12-17 ENCOUNTER — Other Ambulatory Visit (HOSPITAL_COMMUNITY): Payer: Self-pay | Admitting: Family Medicine

## 2019-12-17 DIAGNOSIS — E119 Type 2 diabetes mellitus without complications: Secondary | ICD-10-CM

## 2019-12-17 MED ORDER — METFORMIN HCL 500 MG PO TABS: 500.0000 mg | ORAL_TABLET | Freq: Two times a day (BID) | ORAL | 5 refills | Status: AC

## 2019-12-17 MED ORDER — LISINOPRIL 5 MG PO TABS: 5.0000 mg | ORAL_TABLET | Freq: Every day | ORAL | 3 refills | Status: AC

## 2019-12-17 MED FILL — LISINOPRIL 5 MG TABLET: 5 | 30 days supply | Qty: 30 | Fill #5

## 2019-12-17 MED FILL — METFORMIN HCL 500 MG TABS: 500 | 30 days supply | Qty: 60 | Fill #0

## 2019-12-17 NOTE — Telephone Encounter (Signed)
Patient requesting refills on Metformin and Zestril. He has an appointment next week.

## 2019-12-17 NOTE — Progress Notes (Signed)
Meds ordered this encounter  Medications  . metFORMIN (GLUCOPHAGE) 500 MG tablet    Sig: Take 1 tablet (500 mg total) by mouth 2 (two) times daily with a meal.    Dispense:  60 tablet    Refill:  5    Order Specific Question:   Supervising Provider    Answer:   Quentin Angst [id number]  . lisinopril (ZESTRIL) 5 MG tablet    Sig: Take 1 tablet (5 mg total) by mouth daily.    Dispense:  90 tablet    Refill:  3    Order Specific Question:   Supervising Provider    Answer:   Quentin Angst [[id number]]     Nolon Nations  APRN, MSN, FNP-C Patient Care [facility] [address] [place], [place] [address] [phone]

## 2019-12-21 ENCOUNTER — Ambulatory Visit (INDEPENDENT_AMBULATORY_CARE_PROVIDER_SITE_OTHER): Payer: Self-pay | Admitting: Family Medicine

## 2019-12-21 ENCOUNTER — Encounter: Payer: Self-pay | Admitting: Family Medicine

## 2019-12-21 ENCOUNTER — Other Ambulatory Visit: Payer: Self-pay

## 2019-12-21 VITALS — BP 128/56 | HR 94 | Temp 97.9°F | Resp 20 | Ht 70.25 in | Wt 272.0 lb

## 2019-12-21 DIAGNOSIS — E119 Type 2 diabetes mellitus without complications: Secondary | ICD-10-CM

## 2019-12-21 DIAGNOSIS — Z6841 Body Mass Index (BMI) 40.0 and over, adult: Secondary | ICD-10-CM

## 2019-12-21 LAB — POCT GLYCOSYLATED HEMOGLOBIN (HGB A1C): Hemoglobin A1C: 6.5 % — AB (ref 4.0–5.6)

## 2019-12-21 LAB — GLUCOSE, POCT (MANUAL RESULT ENTRY): POC Glucose: 140 mg/dl — AB (ref 70–99)

## 2019-12-21 NOTE — Patient Instructions (Signed)
3.Diabetes Basics  Diabetes (diabetes mellitus) is a long-term (chronic) disease. It occurs when the body does not properly use sugar (glucose) that is released from food after you eat. Diabetes may be caused by one or both of these problems:  Your pancreas does not make enough of a hormone called insulin.  Your body does not react in a normal way to insulin that it makes. Insulin lets sugars (glucose) go into cells in your body. This gives you energy. If you have diabetes, sugars cannot get into cells. This causes high blood sugar (hyperglycemia). Follow these instructions at home: How is diabetes treated? You may need to take insulin or other diabetes medicines daily to keep your blood sugar in balance. Take your diabetes medicines every day as told by your doctor. List your diabetes medicines here: Diabetes medicines  Name of medicine: ______________________________ ? Amount (dose): _______________ Time (a.m./p.m.): _______________ Notes: ___________________________________  Name of medicine: ______________________________ ? Amount (dose): _______________ Time (a.m./p.m.): _______________ Notes: ___________________________________  Name of medicine: ______________________________ ? Amount (dose): _______________ Time (a.m./p.m.): _______________ Notes: ___________________________________ If you use insulin, you will learn how to give yourself insulin by injection. You may need to adjust the amount based on the food that you eat. List the types of insulin you use here: Insulin  Insulin type: ______________________________ ? Amount (dose): _______________ Time (a.m./p.m.): _______________ Notes: ___________________________________  Insulin type: ______________________________ ? Amount (dose): _______________ Time (a.m./p.m.): _______________ Notes: ___________________________________  Insulin type: ______________________________ ? Amount (dose): _______________ Time (a.m./p.m.):  _______________ Notes: ___________________________________  Insulin type: ______________________________ ? Amount (dose): _______________ Time (a.m./p.m.): _______________ Notes: ___________________________________  Insulin type: ______________________________ ? Amount (dose): _______________ Time (a.m./p.m.): _______________ Notes: ___________________________________ How do I manage my blood sugar?  Check your blood sugar levels using a blood glucose monitor as directed by your doctor. Your doctor will set treatment goals for you. Generally, you should have these blood sugar levels:  Before meals (preprandial): 80-130 mg/dL (9.8-3.3 mmol/L).  After meals (postprandial): below 180 mg/dL (10 mmol/L).  A1c level: less than 7%. Write down the times that you will check your blood sugar levels: Blood sugar checks  Time: _______________ Notes: ___________________________________  Time: _______________ Notes: ___________________________________  Time: _______________ Notes: ___________________________________  Time: _______________ Notes: ___________________________________  Time: _______________ Notes: ___________________________________  Time: _______________ Notes: ___________________________________  What do I need to know about low blood sugar? Low blood sugar is called hypoglycemia. This is when blood sugar is at or below 70 mg/dL (3.9 mmol/L). Symptoms may include:  Feeling: ? Hungry. ? Worried or nervous (anxious). ? Sweaty and clammy. ? Confused. ? Dizzy. ? Sleepy. ? Sick to your stomach (nauseous).  Having: ? A fast heartbeat. ? A headache. ? A change in your vision. ? Tingling or no feeling (numbness) around the mouth, lips, or tongue. ? Jerky movements that you cannot control (seizure).  Having trouble with: ? Moving (coordination). ? Sleeping. ? Passing out (fainting). ? Getting upset easily (irritability). Treating low blood sugar To treat low blood  sugar, eat or drink something sugary right away. If you can think clearly and swallow safely, follow the 15:15 rule:  Take 15 grams of a fast-acting carb (carbohydrate). Talk with your doctor about how much you should take.  Some fast-acting carbs are: ? Sugar tablets (glucose pills). Take 3-4 glucose pills. ? 6-8 pieces of hard candy. ? 4-6 oz (120-150 mL) of fruit juice. ? 4-6 oz (120-150 mL) of regular (not diet) soda. ? 1 Tbsp (15 mL) honey or sugar.  Check your blood sugar 15 minutes after you take the carb.  If your blood sugar is still at or below 70 mg/dL (3.9 mmol/L), take 15 grams of a carb again.  If your blood sugar does not go above 70 mg/dL (3.9 mmol/L) after 3 tries, get help right away.  After your blood sugar goes back to normal, eat a meal or a snack within 1 hour. Treating very low blood sugar If your blood sugar is at or below 54 mg/dL (3 mmol/L), you have very low blood sugar (severe hypoglycemia). This is an emergency. Do not wait to see if the symptoms will go away. Get medical help right away. Call your local emergency services (911 in the [place] Do not drive yourself to the hospital. Questions to ask your health care provider  Do I need to meet with a diabetes educator?  What equipment will I need to care for myself at home?  What diabetes medicines do I need? When should I take them?  How often do I need to check my blood sugar?  What number can I call if I have questions?  When is my next doctor's visit?  Where can I find a support group for people with diabetes? Where to find more information  American Diabetes Association: www.diabetes.org  American Association of Diabetes Educators: www.diabeteseducator.org/patient-resources Contact a doctor if:  Your blood sugar is at or above 240 mg/dL (29.5 mmol/L) for 2 days in a row.  You have been sick or have had a fever for 2 days or more, and you are not getting better.  You have any of these  problems for more than 6 hours: ? You cannot eat or drink. ? You feel sick to your stomach (nauseous). ? You throw up (vomit). ? You have watery poop (diarrhea). Get help right away if:  Your blood sugar is lower than 54 mg/dL (3 mmol/L).  You get confused.  You have trouble: ? Thinking clearly. ? Breathing. Summary  Diabetes (diabetes mellitus) is a long-term (chronic) disease. It occurs when the body does not properly use sugar (glucose) that is released from food after digestion.  Take insulin and diabetes medicines as told.  Check your blood sugar every day, as often as told.  Keep all follow-up visits as told by your doctor. This is important. This information is not intended to replace advice given to you by your health care provider. Make sure you discuss any questions you have with your health care provider. Document Revised: 10/14/2018 Document Reviewed: 04/25/2017 Elsevier Patient Education  2020 [employer].

## 2019-12-21 NOTE — Progress Notes (Signed)
[facility]   Established Patient Office Visit  Subjective:  Patient ID: Tyler Franklin, male    DOB: 1955/08/15  Age: 64 y.o. MRN: [id number]  CC:  Chief Complaint  Patient presents with  . Follow-up    HPI Patricia Perales is a 64 year old male with a medical history significant for type 2 diabetes mellitus, history of pulmonary embolism completed anticoagulation, history of obesity presents for follow-up of chronic conditions.  Patient states that he has been doing well and is without complaint.  He has been taking all medications consistently.  He denies any headache, blurred vision, urinary symptoms, polyuria, polydipsia, or polyphagia.  Patient is a non-smoker.  He is not up-to-date with colonoscopy, he has not been able to get colonoscopy due to insurance constraints.  Also, patient is not up-to-date with yearly eye exam. Patient is not vaccinated against COVID-19.  Past Medical History:  Diagnosis Date  . Diabetes mellitus without complication [facility])     Past Surgical History:  Procedure Laterality Date  . LAPAROSCOPIC APPENDECTOMY N/A 12/28/2016   Procedure: APPENDECTOMY LAPAROSCOPIC;  Surgeon: Clovis Riley, MD;  Location: [facility] OR;  Service: General;  Laterality: N/A;    Family History  Problem Relation Age of Onset  . Stroke Brother   . Deep vein thrombosis Brother     Social History   Socioeconomic History  . Marital status: Single    Spouse name: Not on file  . Number of children: Not on file  . Years of education: Not on file  . Highest education level: Not on file  Occupational History  . Not on file  Tobacco Use  . Smoking status: Never Smoker  . Smokeless tobacco: Never Used  Vaping Use  . Vaping Use: Never used  Substance and Sexual Activity  . Alcohol use: Not Currently    Comment: occ  . Drug use: No  . Sexual activity: Not Currently  Other Topics Concern  . Not on file  Social History Narrative    . Not on file   Social Determinants of Health   Financial Resource Strain:   . Difficulty of Paying Living Expenses: Not on file  Food Insecurity:   . Worried About Charity fundraiser in the Last Year: Not on file  . Ran Out of Food in the Last Year: Not on file  Transportation Needs:   . Lack of Transportation (Medical): Not on file  . Lack of Transportation (Non-Medical): Not on file  Physical Activity:   . Days of Exercise per Week: Not on file  . Minutes of Exercise per Session: Not on file  Stress:   . Feeling of Stress : Not on file  Social Connections:   . Frequency of Communication with Friends and Family: Not on file  . Frequency of Social Gatherings with Friends and Family: Not on file  . Attends Religious Services: Not on file  . Active Member of Clubs or Organizations: Not on file  . Attends Archivist Meetings: Not on file  . Marital Status: Not on file  Intimate Partner Violence:   . Fear of Current or Ex-Partner: Not on file  . Emotionally Abused: Not on file  . Physically Abused: Not on file  . Sexually Abused: Not on file    Outpatient Medications Prior to Visit  Medication Sig Dispense Refill  . aspirin 81 MG tablet Take 81 mg by mouth daily.    . Blood Glucose Monitoring  Suppl (TRUE METRIX METER) w/Device KIT 1 each by Does not apply route daily. 1 kit 0  . glucose blood (TRUE METRIX BLOOD GLUCOSE TEST) test strip Use as instructed 100 each 12  . Lancets MISC 1 each by Does not apply route daily. 60 each 11  . lisinopril (ZESTRIL) 5 MG tablet Take 1 tablet (5 mg total) by mouth daily. 90 tablet 3  . metFORMIN (GLUCOPHAGE) 500 MG tablet Take 1 tablet (500 mg total) by mouth 2 (two) times daily with a meal. 60 tablet 5  . Multiple Vitamin (ONE-A-DAY MENS PO) Take 1 tablet by mouth daily.     No facility-administered medications prior to visit.    No Known Allergies  ROS Review of Systems  Constitutional: Negative for activity change and  appetite change.  HENT: Negative.   Respiratory: Negative.   Cardiovascular: Negative.   Gastrointestinal: Negative.   Endocrine: Negative.  Negative for polydipsia, polyphagia and polyuria.  Musculoskeletal: Negative.   Skin: Negative.   Neurological: Negative.   Psychiatric/Behavioral: Negative.       Objective:    Physical Exam Constitutional:      Appearance: Normal appearance.  Eyes:     Pupils: Pupils are equal, round, and reactive to light.  Cardiovascular:     Rate and Rhythm: Normal rate.     Pulses: Normal pulses.  Pulmonary:     Effort: Pulmonary effort is normal.  Abdominal:     General: Abdomen is flat. Bowel sounds are normal.  Musculoskeletal:        General: Normal range of motion.  Skin:    General: Skin is warm.  Neurological:     General: No focal deficit present.     Mental Status: He is alert. Mental status is at baseline.  Psychiatric:        Mood and Affect: Mood normal.        Behavior: Behavior normal.        Thought Content: Thought content normal.        Judgment: Judgment normal.     There were no vitals taken for this visit. Wt Readings from Last 3 Encounters:  07/13/19 282 lb (127.9 kg)  04/13/19 282 lb (127.9 kg)  10/19/18 282 lb (127.9 kg)     Health Maintenance Due  Topic Date Due  . OPHTHALMOLOGY EXAM  Never done  . COVID-19 Vaccine (1) Never done  . COLONOSCOPY  Never done    There are no preventive care reminders to display for this patient.  No results found for: TSH Lab Results  Component Value Date   WBC 9.6 01/11/2017   HGB 13.3 01/11/2017   HCT 39.9 01/11/2017   MCV 91.9 01/11/2017   PLT 363 01/11/2017   Lab Results  Component Value Date   NA 134 07/13/2019   K 4.5 07/13/2019   CO2 23 07/13/2019   GLUCOSE 171 (H) 07/13/2019   BUN 17 07/13/2019   CREATININE 1.38 (H) 07/13/2019   BILITOT 1.2 01/09/2017   ALKPHOS 56 01/09/2017   AST 24 01/09/2017   ALT 42 01/09/2017   PROT 6.1 (L) 01/09/2017    ALBUMIN 1.9 (L) 01/09/2017   CALCIUM 10.0 07/13/2019   ANIONGAP 8 01/12/2017   Lab Results  Component Value Date   CHOL 145 04/13/2019   Lab Results  Component Value Date   HDL 39 (L) 04/13/2019   Lab Results  Component Value Date   LDLCALC 84 04/13/2019   Lab Results  Component Value Date  TRIG 119 04/13/2019   Lab Results  Component Value Date   CHOLHDL 3.7 04/13/2019   Lab Results  Component Value Date   HGBA1C 7.1 (A) 07/13/2019   HGBA1C 7.1 07/13/2019   HGBA1C 7.1 (A) 07/13/2019   HGBA1C 7.1 (A) 07/13/2019      Assessment & Plan:   Problem List Items Addressed This Visit      Endocrine   Type 2 diabetes mellitus without complication, without long-term current use of insulin (HCC) - Primary   Relevant Orders   HgB A1c (Completed)   Urinalysis Dipstick   Glucose (CBG) (Completed)   Lipid Panel   Basic Metabolic Panel      Type 2 diabetes mellitus without complication, without long-term current use of insulin (HCC) Hemoglobin A1c is 6.5, which is much improved from previous.   A1C goal is less than 7. Fasting blood sugar upon awakening goal is between 110-140.  Your LDL  (bad cholesterol goal is less than 100 Blood pressure goal is <140/90.  Recommend a lowfat, low carbohydrate diet divided over 5-6 small meals, increase water intake to 6-8 glasses, and 150 minutes per week of cardiovascular exercise.   Take your medications as prescribed Make sure that you are familiar with each one of your medications and what they are used to treat.  If you are unsure of medications, please bring to follow up Will send referral for eye exam  Please keep your scheduled follow up appointment.   - HgB A1c - Urinalysis Dipstick - Glucose (CBG) - Lipid Panel - Basic Metabolic Panel  Morbid obesity with BMI of 40.0-44.9, adult [facility]) The patient is asked to make an attempt to improve diet and exercise patterns to aid in medical management of this  problem.   Follow-up: Return in about 6 months (around 06/19/2020).    Donia Pounds  APRN, MSN, FNP-C [facility] [address] [place], [place] [address] [phone]

## 2019-12-22 LAB — BASIC METABOLIC PANEL
BUN/Creatinine Ratio: 10 (ref 10–24)
BUN: 15 mg/dL (ref 8–27)
CO2: 23 mmol/L (ref 20–29)
Calcium: 9.9 mg/dL (ref 8.6–10.2)
Chloride: 101 mmol/L (ref 96–106)
Creatinine, Ser: 1.47 mg/dL — ABNORMAL HIGH (ref 0.76–1.27)
GFR calc Af Amer: 57 mL/min/{1.73_m2} — ABNORMAL LOW (ref >59)
GFR calc non Af Amer: 50 mL/min/{1.73_m2} — ABNORMAL LOW (ref >59)
Glucose: 123 mg/dL — ABNORMAL HIGH (ref 65–99)
Potassium: 5 mmol/L (ref 3.5–5.2)
Sodium: 138 mmol/L (ref 134–144)

## 2019-12-22 LAB — LIPID PANEL
Chol/HDL Ratio: 4.2 ratio (ref 0.0–5.0)
Cholesterol, Total: 157 mg/dL (ref 100–199)
HDL: 37 mg/dL — ABNORMAL LOW (ref >39)
LDL Chol Calc (NIH): 100 mg/dL — ABNORMAL HIGH (ref 0–99)
Triglycerides: 110 mg/dL (ref 0–149)
VLDL Cholesterol Cal: 20 mg/dL (ref 5–40)

## 2019-12-24 ENCOUNTER — Other Ambulatory Visit: Payer: Self-pay | Admitting: Family Medicine

## 2019-12-24 ENCOUNTER — Telehealth: Payer: Self-pay

## 2019-12-24 MED FILL — METFORMIN HCL 500 MG TABS: 500 | 30 days supply | Qty: 60 | Fill #0

## 2019-12-24 MED FILL — LISINOPRIL 5 MG TABLET: 5 | 30 days supply | Qty: 30 | Fill #5

## 2019-12-24 NOTE — Telephone Encounter (Signed)
ATC pt, no answer, LM to RC, CRM created for follow-up. 

## 2019-12-24 NOTE — Telephone Encounter (Signed)
-----   Message from Massie Maroon, [place] sent at 12/24/2019  6:41 AM EST ----- Regarding: lab results Please inform patient that creatinine, indicator of kidney functioning continues to be mildly elevated which is consistent with chronic kidney disease stage II.  We will continue lisinopril 5 mg daily for kidney protection.  Also, it is important that patient continue Metformin 500 mg twice daily for type 2 diabetes mellitus.  Follow carbohydrate modified diet divided over small meals throughout the day.  Also, stay physically active and increase daily water intake.  Follow-up in clinic as scheduled.Nolon Nations  APRN, MSN, FNP-C Patient Care [facility] [address] [place], [place] [address] [phone]

## 2019-12-29 NOTE — Telephone Encounter (Signed)
ATC pt, no answer, LM to RC. UTC letter mailed

## 2020-01-18 MED FILL — METFORMIN HCL 500 MG TABS: 500 | 30 days supply | Qty: 60 | Fill #1

## 2020-01-18 MED FILL — LISINOPRIL 5 MG TABLET: 5 | 30 days supply | Qty: 30 | Fill #6

## 2020-03-07 MED FILL — LISINOPRIL 5 MG TABLET: 5 | 30 days supply | Qty: 30 | Fill #7

## 2020-03-07 MED FILL — METFORMIN HCL 500 MG TABS: 500 | 30 days supply | Qty: 60 | Fill #2

## 2020-03-07 MED FILL — TRUE METRIX GLUCOSE TEST ST: 25 days supply | Qty: 100 | Fill #2

## 2020-05-06 ENCOUNTER — Other Ambulatory Visit: Payer: Self-pay

## 2020-05-08 ENCOUNTER — Other Ambulatory Visit: Payer: Self-pay

## 2020-05-08 MED FILL — Metformin HCl Tab 500 MG: ORAL | 30 days supply | Qty: 60 | Fill #0 | Status: AC

## 2020-05-08 MED FILL — Lisinopril Tab 5 MG: ORAL | 30 days supply | Qty: 30 | Fill #0 | Status: AC

## 2020-05-09 ENCOUNTER — Other Ambulatory Visit: Payer: Self-pay

## 2020-06-15 ENCOUNTER — Other Ambulatory Visit: Payer: Self-pay

## 2020-06-15 MED FILL — Lisinopril Tab 5 MG: ORAL | 30 days supply | Qty: 30 | Fill #1 | Status: AC

## 2020-06-15 MED FILL — Metformin HCl Tab 500 MG: ORAL | 30 days supply | Qty: 60 | Fill #1 | Status: AC

## 2020-06-16 ENCOUNTER — Other Ambulatory Visit: Payer: Self-pay

## 2020-06-20 ENCOUNTER — Ambulatory Visit: Payer: Self-pay | Admitting: Family Medicine

## 2023-09-01 ENCOUNTER — Ambulatory Visit
Admission: EM | Admit: 2023-09-01 | Discharge: 2023-09-01 | Disposition: A | Discharge status: Home / Self Care | Attending: Family Medicine | Admitting: Family Medicine

## 2023-09-01 ENCOUNTER — Ambulatory Visit: Payer: Self-pay | Admitting: Urgent Care

## 2023-09-01 ENCOUNTER — Ambulatory Visit (INDEPENDENT_AMBULATORY_CARE_PROVIDER_SITE_OTHER)

## 2023-09-01 DIAGNOSIS — R0989 Other specified symptoms and signs involving the circulatory and respiratory systems: Secondary | ICD-10-CM

## 2023-09-01 DIAGNOSIS — J40 Bronchitis, not specified as acute or chronic: Secondary | ICD-10-CM | POA: Diagnosis not present

## 2023-09-01 DIAGNOSIS — J329 Chronic sinusitis, unspecified: Secondary | ICD-10-CM | POA: Diagnosis not present

## 2023-09-01 LAB — POCT FASTING CBG KUC MANUAL ENTRY: POCT Glucose (KUC): 96 mg/dL (ref 70–99)

## 2023-09-01 MED ORDER — PREDNISONE 20 MG PO TABS: ORAL_TABLET | ORAL | 0 refills | Status: AC

## 2023-09-01 MED ORDER — AZITHROMYCIN 250 MG PO TABS: ORAL_TABLET | ORAL | 0 refills | Status: AC

## 2023-09-01 NOTE — ED Provider Notes (Signed)
 [facility]  Note:  This document was prepared using Conservation officer, historic buildings and may include unintentional dictation errors.  MRN: [id number] DOB: 07/16/1955  Subjective:   Tyler Franklin is a 68 y.o. male presenting for 1 week history of chest congestion, coughing, sinus congestion, wheezing while working in the heat. No active chest pain. No history of MI. Has a history of pulmonary embolism a few years ago, completed treatment for 6 months and was discontinued. Manages diabetes through diet only. No HTN medications now. Was previously managed with medications but stopped due to medication side effects.  No smoking.  No history of asthma.  No chronic medications.    No Known Allergies  Past Medical History:  Diagnosis Date   Diabetes mellitus without complication (HCC)      Past Surgical History:  Procedure Laterality Date   LAPAROSCOPIC APPENDECTOMY N/A 12/28/2016   Procedure: APPENDECTOMY LAPAROSCOPIC;  Surgeon: Signe Mitzie LABOR, MD;  Location: [facility] OR;  Service: General;  Laterality: N/A;    Family History  Problem Relation Age of Onset   Stroke Brother    Deep vein thrombosis Brother     Social History   Tobacco Use   Smoking status: Never   Smokeless tobacco: Never  Vaping Use   Vaping status: Never Used  Substance Use Topics   Alcohol use: Not Currently    Comment: occ   Drug use: No    ROS   Objective:   Vitals: BP 123/71 (BP Location: Left Arm)   Pulse 72   Temp 98.8 F (37.1 C) (Oral)   Resp 18   SpO2 96%   Physical Exam Constitutional:      General: He is not in acute distress.    Appearance: Normal appearance. He is well-developed and normal weight. He is not ill-appearing, toxic-appearing or diaphoretic.  HENT:     Head: Normocephalic and atraumatic.     Right Ear: Tympanic membrane, ear canal and external ear normal. No drainage, swelling or tenderness. No middle ear effusion. There is no impacted cerumen.  Tympanic membrane is not erythematous or bulging.     Left Ear: Tympanic membrane, ear canal and external ear normal. No drainage, swelling or tenderness.  No middle ear effusion. There is no impacted cerumen. Tympanic membrane is not erythematous or bulging.     Nose: Nose normal. No congestion or rhinorrhea.     Mouth/Throat:     Mouth: Mucous membranes are moist.     Pharynx: No oropharyngeal exudate or posterior oropharyngeal erythema.  Eyes:     General: No scleral icterus.       Right eye: No discharge.        Left eye: No discharge.     Extraocular Movements: Extraocular movements intact.     Conjunctiva/sclera: Conjunctivae normal.  Cardiovascular:     Rate and Rhythm: Normal rate and regular rhythm.     Heart sounds: Normal heart sounds. No murmur heard.    No friction rub. No gallop.  Pulmonary:     Effort: Pulmonary effort is normal. No respiratory distress.     Breath sounds: Normal breath sounds. No stridor. No wheezing, rhonchi or rales.  Musculoskeletal:     Cervical back: Normal range of motion and neck supple. No rigidity. No muscular tenderness.  Neurological:     General: No focal deficit present.     Mental Status: He is alert and oriented to person, place, and time.  Psychiatric:  Mood and Affect: Mood normal.        Behavior: Behavior normal.        Thought Content: Thought content normal.     Results for orders placed or performed during the hospital encounter of 09/01/23 (from the past 24 hours)  POCT CBG (manual entry)     Status: Normal   Collection Time: 09/01/23  2:11 PM  Result Value Ref Range   POCT Glucose ([facility]) 96 70 - 99 mg/dL   Last meal was at ~90:99.   Assessment and Plan :   PDMP not reviewed this encounter.  1. Sinobronchitis   2. Chest congestion    X-ray over-read was pending at time of discharge, recommended follow up with only abnormal results. Otherwise will not call for negative over-read. Patient was in agreement.  Will  manage for sinobronchitis with azithromycin , prednisone  and general supportive care. Patient hemodynamically stable an appropriate for outpatient management. Counseled patient on potential for adverse effects with medications prescribed/recommended today, ER and return-to-clinic precautions discussed, patient verbalized understanding.    Christopher Savannah, [place] 09/01/23 1451

## 2023-09-01 NOTE — Discharge Instructions (Signed)
 I am managing you for an infection called sinobronchitis. This means you take azithromycin  and prednisone  for help you with this. Use Tylenol  for fever, aches and pains. Use Zyrtec for sinus congestion and drainage. Will update you later today with your x-ray results.

## 2023-09-01 NOTE — ED Triage Notes (Addendum)
 Pt reports congestion in chest on and off x 1 week. States is worse when taking a deep breath. Pt have not taken any meds for complaints.
